# Patient Record
Sex: Female | Born: 1980 | Race: Black or African American | Hispanic: No | Marital: Single | Smoking: Never smoker
Health system: Southern US, Community
[De-identification: ages and names within clinical notes are randomized; demographics above are authoritative.]

## PROBLEM LIST (undated history)

## (undated) ENCOUNTER — Ambulatory Visit (HOSPITAL_COMMUNITY): Payer: 59

## (undated) DIAGNOSIS — A159 Respiratory tuberculosis unspecified: Secondary | ICD-10-CM

## (undated) HISTORY — PX: NO PAST SURGERIES: SHX2092

## (undated) HISTORY — DX: Respiratory tuberculosis unspecified: A15.9

---

## 2003-03-24 ENCOUNTER — Emergency Department (HOSPITAL_COMMUNITY): Admission: EM | Admit: 2003-03-24 | Discharge: 2003-03-24 | Payer: Self-pay | Admitting: Emergency Medicine

## 2005-07-19 ENCOUNTER — Emergency Department (HOSPITAL_COMMUNITY): Admission: EM | Admit: 2005-07-19 | Discharge: 2005-07-19 | Payer: Self-pay | Admitting: Emergency Medicine

## 2005-10-04 ENCOUNTER — Ambulatory Visit (HOSPITAL_COMMUNITY): Admission: RE | Admit: 2005-10-04 | Discharge: 2005-10-04 | Payer: Self-pay | Admitting: Obstetrics & Gynecology

## 2005-10-06 LAB — OB RESULTS CONSOLE VARICELLA ZOSTER ANTIBODY, IGG: Varicella: IMMUNE

## 2005-12-19 ENCOUNTER — Ambulatory Visit (HOSPITAL_COMMUNITY): Admission: RE | Admit: 2005-12-19 | Discharge: 2005-12-19 | Payer: Self-pay | Admitting: Obstetrics & Gynecology

## 2006-03-10 ENCOUNTER — Ambulatory Visit: Payer: Self-pay | Admitting: Obstetrics & Gynecology

## 2006-03-13 ENCOUNTER — Ambulatory Visit: Payer: Self-pay | Admitting: Obstetrics & Gynecology

## 2006-03-15 ENCOUNTER — Ambulatory Visit: Payer: Self-pay | Admitting: Obstetrics and Gynecology

## 2006-03-15 ENCOUNTER — Inpatient Hospital Stay (HOSPITAL_COMMUNITY): Admission: AD | Admit: 2006-03-15 | Discharge: 2006-03-18 | Payer: Self-pay | Admitting: Gynecology

## 2006-03-16 ENCOUNTER — Encounter (INDEPENDENT_AMBULATORY_CARE_PROVIDER_SITE_OTHER): Payer: Self-pay | Admitting: *Deleted

## 2007-04-05 ENCOUNTER — Emergency Department (HOSPITAL_COMMUNITY): Admission: EM | Admit: 2007-04-05 | Discharge: 2007-04-05 | Payer: Self-pay | Admitting: Emergency Medicine

## 2007-12-22 IMAGING — US US OB COMP +14 WK
1 series · 13 of 28 positions shown · non-contrast
Comparison: none

CLINICAL DATA: Anatomy scan; uncertain gestational age.

[Series 1: us ob comp +14 wk · 13 of 79 slices shown]
[im 3/79]
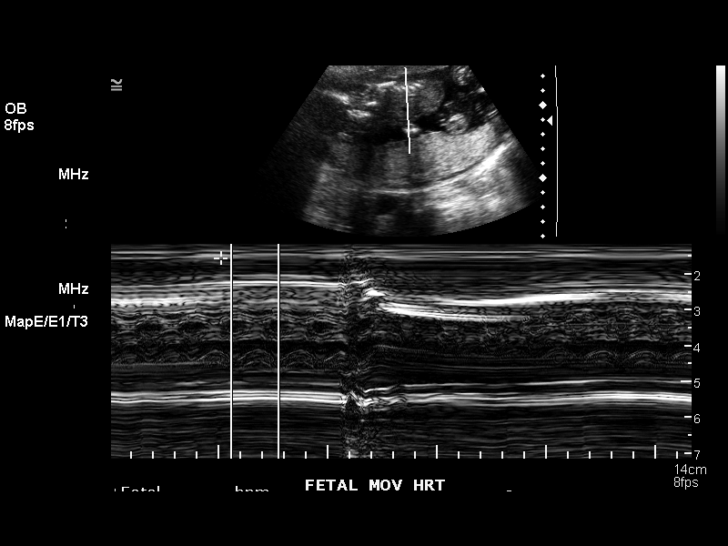
[im 9/79]
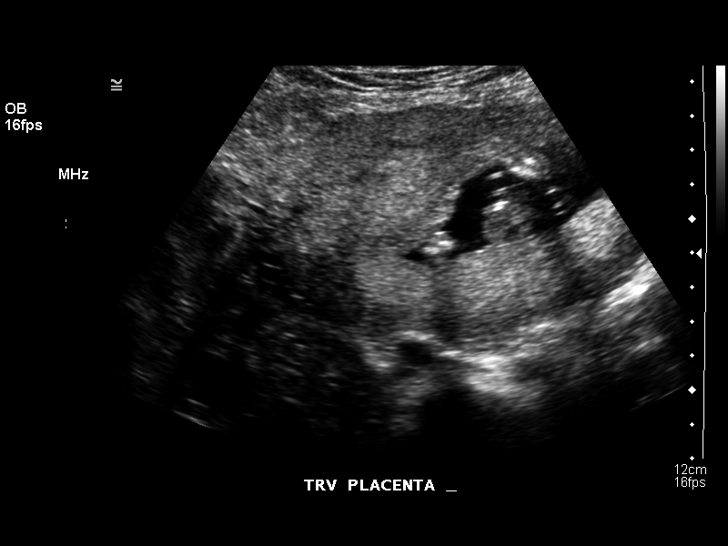
[im 15/79]
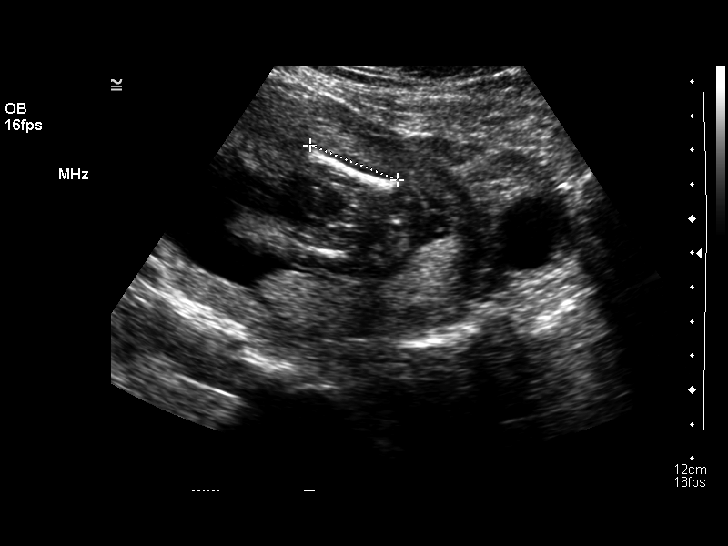
[im 21/79]
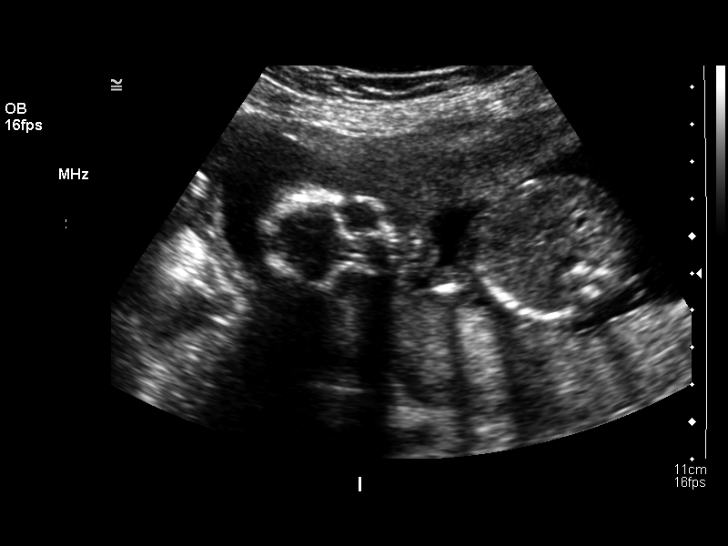
[im 27/79]
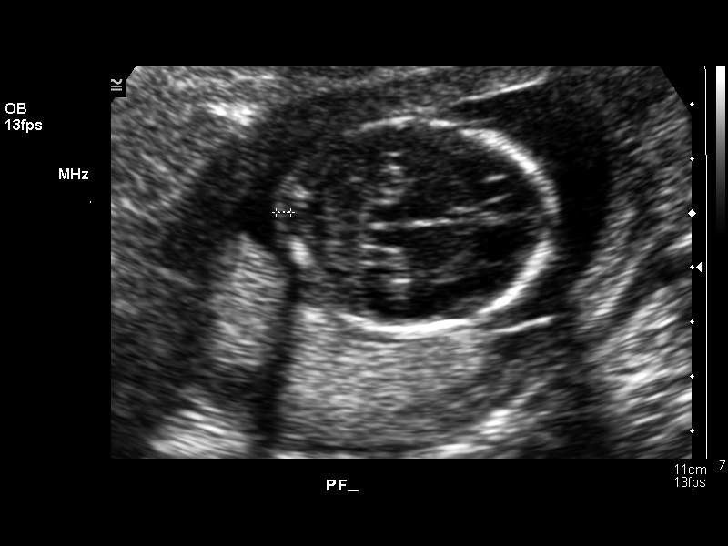
[im 32/79]
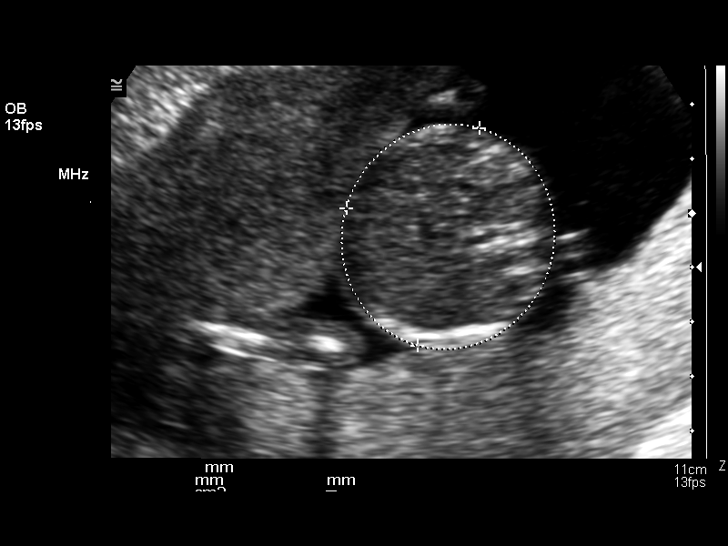
[im 41/79]
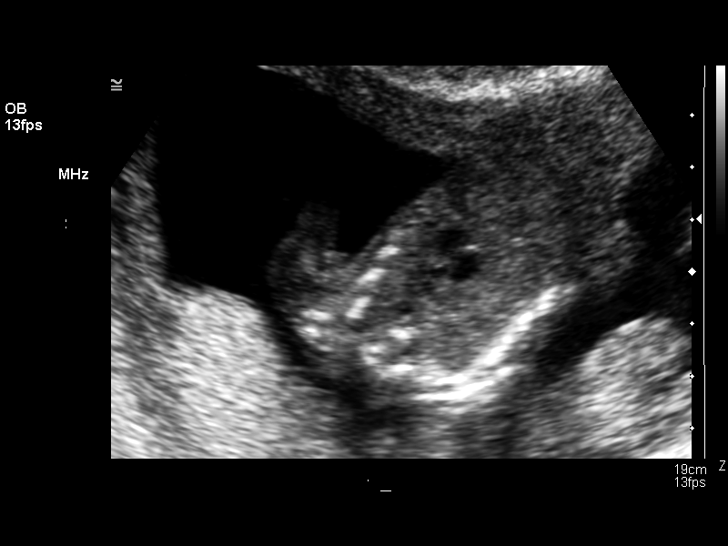
[im 47/79]
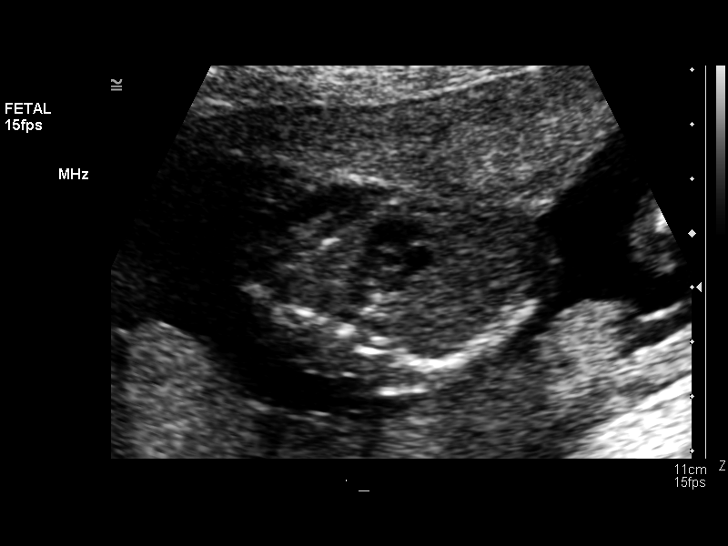
[im 53/79]
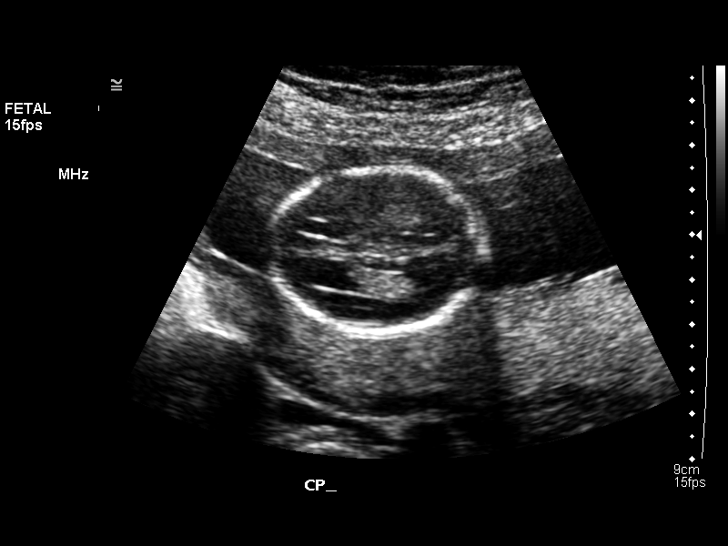
[im 58/79]
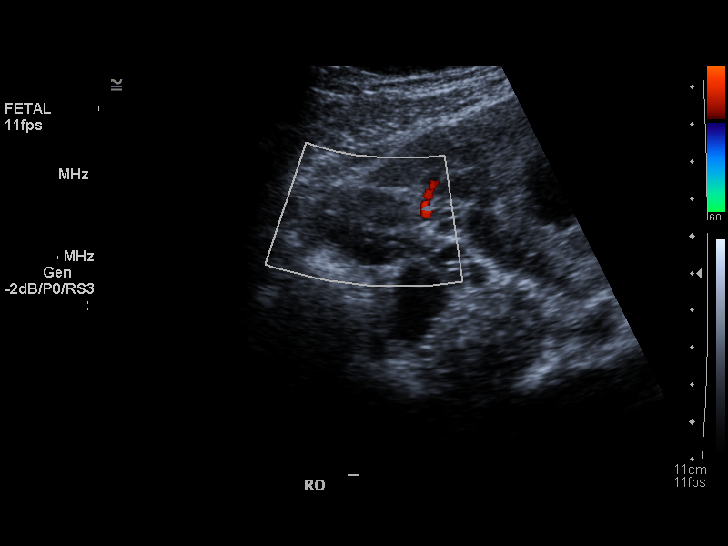
[im 64/79]
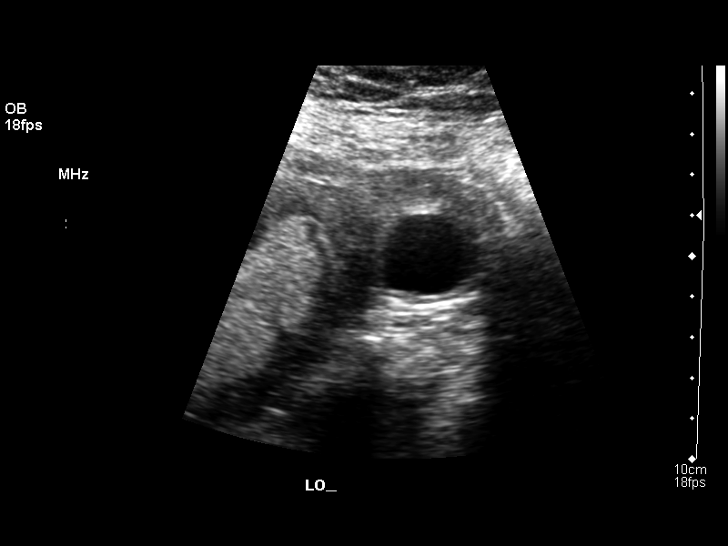
[im 70/79]
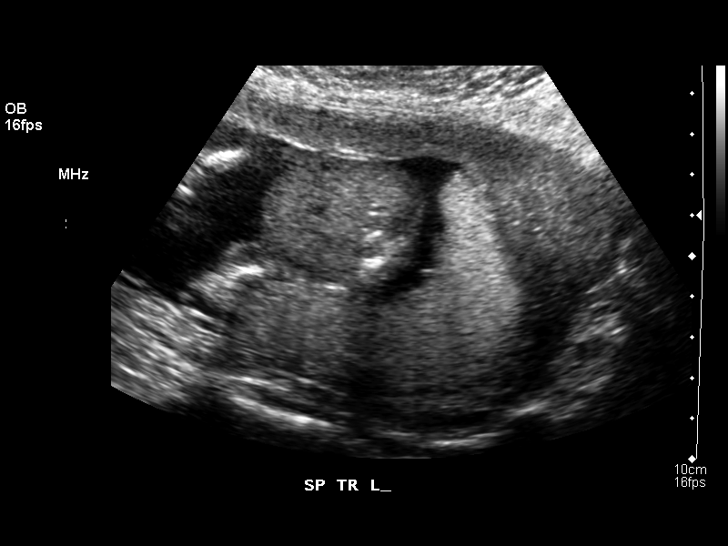
[im 76/79]
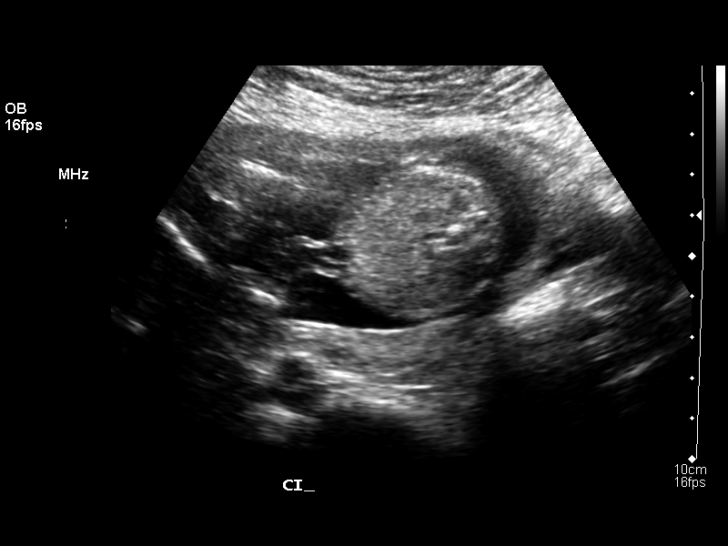

[13 of 28 positions shown; findings below may reference images not displayed]

OBSTETRICAL ULTRASOUND:
 Number of Fetuses: 1
 Heart Rate:  139
 Movement:  Yes
 Breathing:  No  
 Presentation:  Breech
 Placental Location:  Posterior
 Grade:  I
 Previa:  No
 Amniotic Fluid (Subjective):  Normal
 Amniotic Fluid (Objective):   4.0 cm Vertical pocket 

 FETAL BIOMETRY
 BPD:   3.4 cm   17 w 6 d
 HC:   15.0 cm   17 w 5 d
 AC:   13.0 cm   18 w 2 d
 FL:    2.7 cm   18 w 2 d

 MEAN GA:  18 w 0 d  US EDC: 03/07/06

 FETAL ANATOMY
 Lateral Ventricles:    Visualized 
 Thalami/CSP:      Visualized 
 Posterior Fossa:  Visualized 
 Nuchal Region:  NF= 2.4 mm  Visualized 
 Spine:      Visualized 
 4 Chamber Heart on Left:      Visualized 
 Stomach on Left:      Visualized 
 3 Vessel Cord:    Visualized 
 Cord Insertion site:    Visualized 
 Kidneys:  Visualized 
 Bladder:  Visualized   
 Extremities:      Visualized 

 ADDITIONAL ANATOMY VISUALIZED:  LVOT, RVOT, upper lip, orbits, profile, diaphragm, ductal arch, aortic arch, and male genitalia.  Nasal bone is seen.

 MATERNAL UTERINE AND ADNEXAL FINDINGS
 Cervix: 3.5 cm Transabdominally
 Normal right ovary.  Left ovary measures 2.7 x 2.0 x 1.9 cm with simple cyst.
IMPRESSION: There is a single living intrauterine gestation in variable presentation.  The mean gestational age by today's ultrasound is 18 weeks 0 days with an EDC of 03/07/06.  The fetal indices are concordant and the visualized anatomy is normal.

## 2010-03-19 ENCOUNTER — Emergency Department (HOSPITAL_COMMUNITY)
Admission: EM | Admit: 2010-03-19 | Discharge: 2010-03-19 | Payer: Self-pay | Source: Home / Self Care | Admitting: Emergency Medicine

## 2010-04-29 ENCOUNTER — Inpatient Hospital Stay (INDEPENDENT_AMBULATORY_CARE_PROVIDER_SITE_OTHER)
Admission: RE | Admit: 2010-04-29 | Discharge: 2010-04-29 | Disposition: A | Discharge status: Home / Self Care | Payer: No Typology Code available for payment source | Source: Ambulatory Visit | Attending: Family Medicine | Admitting: Family Medicine

## 2010-04-29 DIAGNOSIS — S139XXA Sprain of joints and ligaments of unspecified parts of neck, initial encounter: Secondary | ICD-10-CM

## 2010-05-31 LAB — URINE MICROSCOPIC-ADD ON

## 2010-05-31 LAB — URINALYSIS, ROUTINE W REFLEX MICROSCOPIC
Glucose, UA: NEGATIVE mg/dL
Hgb urine dipstick: NEGATIVE
Protein, ur: NEGATIVE mg/dL
Specific Gravity, Urine: 1.034 — ABNORMAL HIGH (ref 1.005–1.030)

## 2010-05-31 LAB — POCT PREGNANCY, URINE: Preg Test, Ur: NEGATIVE

## 2010-05-31 LAB — GLUCOSE, CAPILLARY: Glucose-Capillary: 123 mg/dL — ABNORMAL HIGH (ref 70–99)

## 2010-12-09 LAB — PREGNANCY, URINE: Preg Test, Ur: NEGATIVE

## 2010-12-09 LAB — URINALYSIS, ROUTINE W REFLEX MICROSCOPIC
Nitrite: NEGATIVE
Specific Gravity, Urine: 1.026
Urobilinogen, UA: 1

## 2010-12-13 ENCOUNTER — Emergency Department (HOSPITAL_COMMUNITY)
Admission: EM | Admit: 2010-12-13 | Discharge: 2010-12-13 | Disposition: A | Discharge status: Home / Self Care | Payer: Self-pay | Attending: Emergency Medicine | Admitting: Emergency Medicine

## 2010-12-13 DIAGNOSIS — R6883 Chills (without fever): Secondary | ICD-10-CM | POA: Insufficient documentation

## 2010-12-13 DIAGNOSIS — M545 Low back pain, unspecified: Secondary | ICD-10-CM | POA: Insufficient documentation

## 2010-12-13 DIAGNOSIS — N12 Tubulo-interstitial nephritis, not specified as acute or chronic: Secondary | ICD-10-CM | POA: Insufficient documentation

## 2010-12-13 LAB — POCT PREGNANCY, URINE: Preg Test, Ur: NEGATIVE

## 2010-12-13 LAB — CBC
Hemoglobin: 11 g/dL — ABNORMAL LOW (ref 12.0–15.0)
MCH: 30.4 pg (ref 26.0–34.0)
RBC: 3.62 MIL/uL — ABNORMAL LOW (ref 3.87–5.11)
WBC: 5.2 10*3/uL (ref 4.0–10.5)

## 2010-12-13 LAB — DIFFERENTIAL
Basophils Relative: 0 % (ref 0–1)
Monocytes Relative: 21 % — ABNORMAL HIGH (ref 3–12)
Neutro Abs: 2.5 10*3/uL (ref 1.7–7.7)
Neutrophils Relative %: 48 % (ref 43–77)

## 2010-12-13 LAB — BASIC METABOLIC PANEL
CO2: 27 mEq/L (ref 19–32)
Calcium: 8.5 mg/dL (ref 8.4–10.5)
Chloride: 107 mEq/L (ref 96–112)
Glucose, Bld: 110 mg/dL — ABNORMAL HIGH (ref 70–99)
Potassium: 3.5 mEq/L (ref 3.5–5.1)
Sodium: 138 mEq/L (ref 135–145)

## 2010-12-13 LAB — URINALYSIS, ROUTINE W REFLEX MICROSCOPIC
Bilirubin Urine: NEGATIVE
Ketones, ur: NEGATIVE mg/dL
Nitrite: NEGATIVE
Urobilinogen, UA: 1 mg/dL (ref 0.0–1.0)

## 2010-12-13 LAB — URINE MICROSCOPIC-ADD ON

## 2010-12-14 LAB — URINE CULTURE
Colony Count: >=100000
Culture  Setup Time: 201209241132

## 2010-12-23 ENCOUNTER — Inpatient Hospital Stay (INDEPENDENT_AMBULATORY_CARE_PROVIDER_SITE_OTHER)
Admission: RE | Admit: 2010-12-23 | Discharge: 2010-12-23 | Disposition: A | Discharge status: Home / Self Care | Payer: Self-pay | Source: Ambulatory Visit | Attending: Emergency Medicine | Admitting: Emergency Medicine

## 2010-12-23 DIAGNOSIS — N12 Tubulo-interstitial nephritis, not specified as acute or chronic: Secondary | ICD-10-CM

## 2010-12-23 LAB — POCT URINALYSIS DIP (DEVICE)
Hgb urine dipstick: NEGATIVE
Leukocytes, UA: NEGATIVE
Nitrite: NEGATIVE
Protein, ur: NEGATIVE mg/dL
Urobilinogen, UA: 0.2 mg/dL (ref 0.0–1.0)
pH: 5.5 (ref 5.0–8.0)

## 2014-02-14 ENCOUNTER — Emergency Department (HOSPITAL_COMMUNITY)
Admission: EM | Admit: 2014-02-14 | Discharge: 2014-02-14 | Disposition: A | Discharge status: Home / Self Care | Payer: No Typology Code available for payment source | Attending: Emergency Medicine | Admitting: Emergency Medicine

## 2014-02-14 DIAGNOSIS — R51 Headache: Secondary | ICD-10-CM | POA: Insufficient documentation

## 2014-02-14 DIAGNOSIS — R519 Headache, unspecified: Secondary | ICD-10-CM

## 2014-02-14 DIAGNOSIS — R0981 Nasal congestion: Secondary | ICD-10-CM | POA: Insufficient documentation

## 2014-02-14 MED ORDER — SODIUM CHLORIDE 0.9 % IV BOLUS (SEPSIS)
1000.0000 mL | Freq: Once | INTRAVENOUS | Status: AC
  Administered 2014-02-14: 1000 mL via INTRAVENOUS

## 2014-02-14 MED ORDER — METOCLOPRAMIDE HCL 5 MG/ML IJ SOLN
10.0000 mg | Freq: Once | INTRAMUSCULAR | Status: AC
  Administered 2014-02-14: 10 mg via INTRAVENOUS
  Filled 2014-02-14: qty 2

## 2014-02-14 MED ORDER — DIPHENHYDRAMINE HCL 50 MG/ML IJ SOLN
25.0000 mg | Freq: Once | INTRAMUSCULAR | Status: AC
  Administered 2014-02-14: 25 mg via INTRAVENOUS
  Filled 2014-02-14: qty 1

## 2014-02-14 MED ORDER — KETOROLAC TROMETHAMINE 30 MG/ML IJ SOLN
30.0000 mg | Freq: Once | INTRAMUSCULAR | Status: AC
  Administered 2014-02-14: 30 mg via INTRAVENOUS
  Filled 2014-02-14: qty 1

## 2014-02-14 MED ORDER — IBUPROFEN 800 MG PO TABS: 800.0000 mg | ORAL_TABLET | Freq: Three times a day (TID) | ORAL | Status: DC | PRN

## 2014-02-14 NOTE — ED Provider Notes (Signed)
CSN: [id number]     Arrival date & time 02/14/14  1014 History   First MD Initiated Contact with Patient 02/14/14 1017     Chief Complaint  Patient presents with  . Headache     (Consider location/radiation/quality/duration/timing/severity/associated sxs/prior Treatment) HPI   Pt p/w left frontal headache that has been intermittent over the past 3 days.  Lasts 2-3 hours at time, has temporarily mild improvement with aleve, cold compresses.  At worst her pain is 10/10 intensity, throbbing/pounding.  Currently 9/10.  Does have associated nasal congestion.  Denies fevers, recent illness, head trauma, neck pain or stiffness, sudden onset or "thunderclap" headache.  Denies focal neurologic deficits. Denies URI symptoms, vision change, N/V, dental pain.  LMP last week, normal (pt has irregular periods).  Denies possibility of pregnancy.     No past medical history on file. No past surgical history on file. No family history on file. History  Substance Use Topics  . Smoking status: Not on file  . Smokeless tobacco: Not on file  . Alcohol Use: Not on file   OB History    No data available     Review of Systems  All other systems reviewed and are negative.     Allergies  Review of patient's allergies indicates not on file.  Home Medications   Prior to Admission medications   Not on File   BP 119/78 mmHg  Pulse 96  Temp(Src) 98.3 F (36.8 C) (Oral)  Resp 18  Ht 5' (1.524 m)  Wt 160 lb (72.576 kg)  BMI 31.25 kg/m2  SpO2 100% Physical Exam  Constitutional: She appears well-developed and well-nourished. No distress.  HENT:  Head: Normocephalic and atraumatic.  Neck: Normal range of motion. Neck supple.  Cardiovascular: Normal rate and regular rhythm.   Pulmonary/Chest: Effort normal and breath sounds normal. No respiratory distress. She has no wheezes. She has no rales.  Abdominal: Soft. She exhibits no distension. There is no tenderness. There is no rebound and no  guarding.  Neurological: She is alert.  CN II-XII intact, EOMs intact, no pronator drift, grip strengths equal bilaterally; strength 5/5 in all extremities, sensation intact in all extremities; finger to nose, heel to shin, rapid alternating movements normal; gait is normal.     Skin: She is not diaphoretic.  Nursing note and vitals reviewed.   ED Course  Procedures (including critical care time) Labs Review Labs Reviewed - No data to display  Imaging Review No results found.   EKG Interpretation None     12:13 PM Pt reports complete relief of pain.   MDM   Final diagnoses:  Acute nonintractable headache, unspecified headache type    Afebrile, nontoxic patient with headache.  No red flags including head trauma, fevers, meningeal signs, focal neurologic deficits.  Migraine cocktail given with complete relief.    D/C home with PCP follow up.  Discussed result, findings, treatment, and follow up  with patient.  Pt given return precautions.  Pt verbalizes understanding and agrees with plan.      Trixie Dredgemily Armond Cuthrell, PA-C 02/14/14 1618  Doug SouSam Jacubowitz, MD 02/14/14 1705

## 2014-02-14 NOTE — ED Notes (Signed)
Patient to ED with C/O a headache that began yesterday at 7 PM. Pain is on the left side of her head.  Indicates a left frontal headache. Pain is temporarily relieved with aleve.  Denies nausea, vomiting and visual changes.  Denies weakness.

## 2014-02-14 NOTE — Discharge Instructions (Signed)
Read the information below.  Use the prescribed medication as directed.  Please discuss all new medications with your pharmacist.  You may return to the Emergency Department at any time for worsening condition or any new symptoms that concern you.    If there is any possibility that you might be pregnant, please let your health care provider know and discuss this with the pharmacist to ensure medication safety.    You are having a headache. No specific cause was found today for your headache. It may have been a migraine or other cause of headache. Stress, anxiety, fatigue, and depression are common triggers for headaches. Your headache today does not appear to be life-threatening or require hospitalization, but often the exact cause of headaches is not determined in the emergency department. Therefore, follow-up with your doctor is very important to find out what may have caused your headache, and whether or not you need any further diagnostic testing or treatment. Sometimes headaches can appear benign (not harmful), but then more serious symptoms can develop which should prompt an immediate re-evaluation by your doctor or the emergency department. SEEK MEDICAL ATTENTION IF: You develop possible problems with medications prescribed.  The medications don't resolve your headache, if it recurs , or if you have multiple episodes of vomiting or can't take fluids. You have a change from the usual headache. RETURN IMMEDIATELY IF you develop a sudden, severe headache or confusion, become poorly responsive or faint, develop a fever above 100.25F or problem breathing, have a change in speech, vision, swallowing, or understanding, or develop new weakness, numbness, tingling, incoordination, or have a seizure.    General Headache Without Cause A general headache is pain or discomfort felt around the head or neck area. The cause may not be found.  HOME CARE   Keep all doctor visits.  Only take medicines as told by  your doctor.  Lie down in a dark, quiet room when you have a headache.  Keep a journal to find out if certain things bring on headaches. For example, write down:  What you eat and drink.  How much sleep you get.  Any change to your diet or medicines.  Relax by getting a massage or doing other relaxing activities.  Put ice or heat packs on the head and neck area as told by your doctor.  Lessen stress.  Sit up straight. Do not tighten (tense) your muscles.  Quit smoking if you smoke.  Lessen how much alcohol you drink.  Lessen how much caffeine you drink, or stop drinking caffeine.  Eat and sleep on a regular schedule.  Get 7 to 9 hours of sleep, or as told by your doctor.  Keep lights dim if bright lights bother you or make your headaches worse. GET HELP RIGHT AWAY IF:   Your headache becomes really bad.  You have a fever.  You have a stiff neck.  You have trouble seeing.  Your muscles are weak, or you lose muscle control.  You lose your balance or have trouble walking.  You feel like you will pass out (faint), or you pass out.  You have really bad symptoms that are different than your first symptoms.  You have problems with the medicines given to you by your doctor.  Your medicines do not work.  Your headache feels different than the other headaches.  You feel sick to your stomach (nauseous) or throw up (vomit). MAKE SURE YOU:   Understand these instructions.  Will watch your condition.  Will get help right away if you are not doing well or get worse. Document Released: 12/15/2007 Document Revised: 05/30/2011 Document Reviewed: 02/25/2011 [facility] Patient Information 2015 [place]. This information is not intended to replace advice given to you by your health care provider. Make sure you discuss any questions you have with your health care provider.  Headaches, Frequently Asked Questions MIGRAINE HEADACHES Q: What is migraine? What causes it?  How can I treat it? A: Generally, migraine headaches begin as a dull ache. Then they develop into a constant, throbbing, and pulsating pain. You may experience pain at the temples. You may experience pain at the front or back of one or both sides of the head. The pain is usually accompanied by a combination of:  Nausea.  Vomiting.  Sensitivity to light and noise. Some people (about 15%) experience an aura (see below) before an attack. The cause of migraine is believed to be chemical reactions in the brain. Treatment for migraine may include over-the-counter or prescription medications. It may also include self-help techniques. These include relaxation training and biofeedback.  Q: What is an aura? A: About 15% of people with migraine get an "aura". This is a sign of neurological symptoms that occur before a migraine headache. You may see wavy or jagged lines, dots, or flashing lights. You might experience tunnel vision or blind spots in one or both eyes. The aura can include visual or auditory hallucinations (something imagined). It may include disruptions in smell (such as strange odors), taste or touch. Other symptoms include:  Numbness.  A "pins and needles" sensation.  Difficulty in recalling or speaking the correct word. These neurological events may last as long as 60 minutes. These symptoms will fade as the headache begins. Q: What is a trigger? A: Certain physical or environmental factors can lead to or "trigger" a migraine. These include:  Foods.  Hormonal changes.  Weather.  Stress. It is important to remember that triggers are different for everyone. To help prevent migraine attacks, you need to figure out which triggers affect you. Keep a headache diary. This is a good way to track triggers. The diary will help you talk to your healthcare professional about your condition. Q: Does weather affect migraines? A: Bright sunshine, hot, humid conditions, and drastic changes in  barometric pressure may lead to, or "trigger," a migraine attack in some people. But studies have shown that weather does not act as a trigger for everyone with migraines. Q: What is the link between migraine and hormones? A: Hormones start and regulate many of your body's functions. Hormones keep your body in balance within a constantly changing environment. The levels of hormones in your body are unbalanced at times. Examples are during menstruation, pregnancy, or menopause. That can lead to a migraine attack. In fact, about three quarters of all women with migraine report that their attacks are related to the menstrual cycle.  Q: Is there an increased risk of stroke for migraine sufferers? A: The likelihood of a migraine attack causing a stroke is very remote. That is not to say that migraine sufferers cannot have a stroke associated with their migraines. In persons under age 32, the most common associated factor for stroke is migraine headache. But over the course of a person's normal life span, the occurrence of migraine headache may actually be associated with a reduced risk of dying from cerebrovascular disease due to stroke.  Q: What are acute medications for migraine? A: Acute medications are used to  treat the pain of the headache after it has started. Examples over-the-counter medications, NSAIDs, ergots, and triptans.  Q: What are the triptans? A: Triptans are the newest class of abortive medications. They are specifically targeted to treat migraine. Triptans are vasoconstrictors. They moderate some chemical reactions in the brain. The triptans work on receptors in your brain. Triptans help to restore the balance of a neurotransmitter called serotonin. Fluctuations in levels of serotonin are thought to be a main cause of migraine.  Q: Are over-the-counter medications for migraine effective? A: Over-the-counter, or "OTC," medications may be effective in relieving mild to moderate pain and  associated symptoms of migraine. But you should see your caregiver before beginning any treatment regimen for migraine.  Q: What are preventive medications for migraine? A: Preventive medications for migraine are sometimes referred to as "prophylactic" treatments. They are used to reduce the frequency, severity, and length of migraine attacks. Examples of preventive medications include antiepileptic medications, antidepressants, beta-blockers, calcium channel blockers, and NSAIDs (nonsteroidal anti-inflammatory drugs). Q: Why are anticonvulsants used to treat migraine? A: During the past few years, there has been an increased interest in antiepileptic drugs for the prevention of migraine. They are sometimes referred to as "anticonvulsants". Both epilepsy and migraine may be caused by similar reactions in the brain.  Q: Why are antidepressants used to treat migraine? A: Antidepressants are typically used to treat people with depression. They may reduce migraine frequency by regulating chemical levels, such as serotonin, in the brain.  Q: What alternative therapies are used to treat migraine? A: The term "alternative therapies" is often used to describe treatments considered outside the scope of conventional Western medicine. Examples of alternative therapy include acupuncture, acupressure, and yoga. Another common alternative treatment is herbal therapy. Some herbs are believed to relieve headache pain. Always discuss alternative therapies with your caregiver before proceeding. Some herbal products contain arsenic and other toxins. TENSION HEADACHES Q: What is a tension-type headache? What causes it? How can I treat it? A: Tension-type headaches occur randomly. They are often the result of temporary stress, anxiety, fatigue, or anger. Symptoms include soreness in your temples, a tightening band-like sensation around your head (a "vice-like" ache). Symptoms can also include a pulling feeling, pressure  sensations, and contracting head and neck muscles. The headache begins in your forehead, temples, or the back of your head and neck. Treatment for tension-type headache may include over-the-counter or prescription medications. Treatment may also include self-help techniques such as relaxation training and biofeedback. CLUSTER HEADACHES Q: What is a cluster headache? What causes it? How can I treat it? A: Cluster headache gets its name because the attacks come in groups. The pain arrives with little, if any, warning. It is usually on one side of the head. A tearing or bloodshot eye and a runny nose on the same side of the headache may also accompany the pain. Cluster headaches are believed to be caused by chemical reactions in the brain. They have been described as the most severe and intense of any headache type. Treatment for cluster headache includes prescription medication and oxygen. SINUS HEADACHES Q: What is a sinus headache? What causes it? How can I treat it? A: When a cavity in the bones of the face and skull (a sinus) becomes inflamed, the inflammation will cause localized pain. This condition is usually the result of an allergic reaction, a tumor, or an infection. If your headache is caused by a sinus blockage, such as an infection, you will probably have  a fever. An x-ray will confirm a sinus blockage. Your caregiver's treatment might include antibiotics for the infection, as well as antihistamines or decongestants.  REBOUND HEADACHES Q: What is a rebound headache? What causes it? How can I treat it? A: A pattern of taking acute headache medications too often can lead to a condition known as "rebound headache." A pattern of taking too much headache medication includes taking it more than 2 days per week or in excessive amounts. That means more than the label or a caregiver advises. With rebound headaches, your medications not only stop relieving pain, they actually begin to cause headaches.  Doctors treat rebound headache by tapering the medication that is being overused. Sometimes your caregiver will gradually substitute a different type of treatment or medication. Stopping may be a challenge. Regularly overusing a medication increases the potential for serious side effects. Consult a caregiver if you regularly use headache medications more than 2 days per week or more than the label advises. ADDITIONAL QUESTIONS AND ANSWERS Q: What is biofeedback? A: Biofeedback is a self-help treatment. Biofeedback uses special equipment to monitor your body's involuntary physical responses. Biofeedback monitors:  Breathing.  Pulse.  Heart rate.  Temperature.  Muscle tension.  Brain activity. Biofeedback helps you refine and perfect your relaxation exercises. You learn to control the physical responses that are related to stress. Once the technique has been mastered, you do not need the equipment any more. Q: Are headaches hereditary? A: Four out of five (80%) of people that suffer report a family history of migraine. Scientists are not sure if this is genetic or a family predisposition. Despite the uncertainty, a child has a 50% chance of having migraine if one parent suffers. The child has a 75% chance if both parents suffer.  Q: Can children get headaches? A: By the time they reach high school, most young people have experienced some type of headache. Many safe and effective approaches or medications can prevent a headache from occurring or stop it after it has begun.  Q: What type of doctor should I see to diagnose and treat my headache? A: Start with your primary caregiver. Discuss his or her experience and approach to headaches. Discuss methods of classification, diagnosis, and treatment. Your caregiver may decide to recommend you to a headache specialist, depending upon your symptoms or other physical conditions. Having diabetes, allergies, etc., may require a more comprehensive and  inclusive approach to your headache. The National Headache Foundation will provide, upon request, a list of [facility] physician members in your state. Document Released: 05/28/2003 Document Revised: 05/30/2011 Document Reviewed: 11/05/2007 [facility] Patient Information 2015 [place], [place]. This information is not intended to replace advice given to you by your health care provider. Make sure you discuss any questions you have with your health care provider.    Emergency Department Resource Guide 1) Find a Doctor and Pay Out of Pocket Although you won't have to find out who is covered by your insurance plan, it is a good idea to ask around and get recommendations. You will then need to call the office and see if the doctor you have chosen will accept you as a new patient and what types of options they offer for patients who are self-pay. Some doctors offer discounts or will set up payment plans for their patients who do not have insurance, but you will need to ask so you aren't surprised when you get to your appointment.  2) Contact Your Local Health Department Not all  health departments have doctors that can see patients for sick visits, but many do, so it is worth a call to see if yours does. If you don't know where your local health department is, you can check in your phone book. The CDC also has a tool to help you locate your state's health department, and many state websites also have listings of all of their local health departments.  3) Find a Walk-in Clinic If your illness is not likely to be very severe or complicated, you may want to try a walk in clinic. These are popping up all over the country in pharmacies, drugstores, and shopping centers. They're usually staffed by nurse practitioners or physician assistants that have been trained to treat common illnesses and complaints. They're usually fairly quick and inexpensive. However, if you have serious medical issues or chronic medical problems,  these are probably not your best option.  No Primary Care Doctor: - Call Health Connect at  [phone] - they can help you locate a primary care doctor that  accepts your insurance, provides certain services, etc. - Physician Referral Service- [phone]  Chronic Pain Problems: Organization         Address  Phone   Notes  [facility]  [phone] Patients need to be referred by their primary care doctor.   Medication Assistance: Organization         Address  Phone   Notes  [facility] Medication [facility] [address] [place], [place] [address] [phone] --Must be a resident of [facility] -- Must have NO insurance coverage whatsoever (no Medicaid/ Medicare, etc.) -- The pt. MUST have a primary care doctor that directs their care regularly and follows them in the community   [facility]  [phone]   [employer]  [phone]    Agencies that provide inexpensive medical care: Organization         Address  Phone   Notes  [facility]  [phone]   [facility]    [phone]   [facility] [address] [place], [place] [address] [phone]   [facility] [address] [place] [phone]   Planned Parenthood    [phone]   [facility]    [phone]   [facility]  [address], [place] Phone:  [phone], Fax:  [phone] Hours of Operation:  9 am - 6 pm, M-F.  Also accepts Medicaid/Medicare and self-pay.  [facility]  [address], [place] Phone: [phone], Fax: [phone]. Hours of Operation:  8:30 am - 5:30 pm, M-F.  Also accepts Medicaid and self-pay.  [facility] [place] [address], [place] Phone: [phone]   [facility] [address] Natasha Bence [place], [place] [phone], Ext. [phone] Mondays &  Thursdays: 7-9 AM.  First 15 patients are seen on a first come, first serve basis.    Medicaid-accepting [facility] Providers:  Organization         Address  Phone   Notes  [facility] [address], [place] [phone] Also accepts self-pay patients.  [facility] [address] Laurell Josephs [place], [place]  [phone]   [facility] [address], [place] [phone]   [facility] [address] [place] [phone]   Renaye Rakers [address], [place]   [phone] Only accepts [place] patients after they have their name applied to their card.   Self-Pay (no insurance) in [facility]:  Organization         Address  Phone   Notes  Sickle Cell Patients, [facility] [address] [place], [place] [phone]   [facility] [address] [place], [place] [phone]   [facility] [place]  [address], [place] [phone]   [facility]/Dr. Osei-Bonsu  [address], [place] or [address], [place] [phone] Phone number for both [place] and [place] locations is the same.  [facility] [address] [place] [phone]   [facility] [address], [place] or [address] [phone]   [facility] [address], [place] [phone], phone; [phone], fax Sees patients 1st and 3rd Saturday of every month.  Must not qualify for public or private insurance (i.e. Medicaid, Medicare, [place] Health Choice, Veterans' Benefits)  Household income should be no more than 200% of the poverty level The clinic cannot treat you if you are pregnant or think you are pregnant  Sexually transmitted diseases are not treated at the  clinic.    Dental Care: Organization         Address  Phone  Notes  [facility] [address] [place], [place] [phone] Accepts children up to age 73 who are enrolled in [place] or [place] Health Choice; pregnant women with a Medicaid card; and children who have applied for Medicaid or [place] Health Choice, but were declined, whose parents can pay a reduced fee at time of service.  [facility]  [address], [place] [phone] Accepts children up to age 26 who are enrolled in [place] or [place] Health Choice; pregnant women with a Medicaid card; and children who have applied for Medicaid or [place] Health Choice, but were declined, whose parents can pay a reduced fee at time of service.  [facility]  [address] [place], [place] [phone] Patients are seen by appointment only. Walk-ins are not accepted. [facility] will see patients 49 years of age and older. Monday - Tuesday (8am-5pm) Most Wednesdays (8:30-5pm) $30 per visit, cash only  [facility]  [address], [place] [facility] [phone] Patients are seen by appointment only. Walk-ins are not accepted. [facility] will see patients 26 years of age and older. One Wednesday Evening (Monthly: Volunteer Based).  $30 per visit, cash only  [employer]  [phone] for adults; Children under age 44, call Graduate Pediatric Dentistry at [phone]. Children aged 95-14, please call [phone] to request a pediatric application.  Dental services are provided in all areas of dental care including fillings, crowns and bridges, complete and partial dentures, implants, gum treatment, root canals, and extractions. Preventive care is also provided. Treatment is provided to both adults and children. Patients are selected via a lottery and there is often a  waiting list.   [facility] [address] [place]  [phone] [email or web address]   [facility] [address] [place], [place] [phone], Ext. [phone] Second and Fourth Thursday of each month, opens at 6:30 AM; Clinic ends at 9 AM.  Patients are seen on a first-come first-served basis, and a limited number are seen during each clinic.   [facility]  [address] Ether Griffins [place], [place] [phone]   Eligibility Requirements You must have lived in [place], [place], or [place] counties for at least the last three months.   You cannot be eligible for state or federal sponsored [place], including [employer], [place], or [employer].   You generally cannot be eligible for healthcare insurance through your employer.    How to apply: Eligibility screenings are held every Tuesday and Wednesday afternoon from 1:00 pm until 4:00 pm. You do not need an appointment for the interview!  [facility] [address], [place], [place] [phone]   [facility]  [phone]   [facility] Health Department  [phone]   [facility]  [phone]    Behavioral Health Resources in the Community: Intensive Outpatient Programs Organization         Address  Phone  Notes  [facility] [address], [place], [place] [phone]   [facility] [address] [place], [place] [phone]   [facility] [address] [place], [place]  [phone]   [facility] [address],  [place], [place] [phone] or [phone]   Substance Abuse Resources Organization         Address  Phone  Notes  [facility]  [phone]   [facility]  [phone]   [facility]  [phone]   Floydene Flock  [phone]   Residential & Outpatient Substance Abuse  Program  [phone]   Psychological Services Organization         Address  Phone  Notes  [place] [facility]  [phone]   [facility]  [phone]   [facility] [address], [place] [phone] or [phone]    Mobile Crisis Teams Organization         Address  Phone  Notes  [facility], Mobile Crisis Care Unit  [phone]   [facility]  [address] [place], [place] [phone]   Doristine Locks [address] [place] [phone]    Self-Help/Support Groups Organization         Address  Phone             Notes  [facility] [place] - variety of support groups  [phone]- [phone] Call for more information  Narcotics Anonymous (NA), [facility] [address], [employer] [place]  2 meetings at this location   Statistician         Address  Phone  Notes  [facility] [address],    [place]  [phone]   [facility]  [address] [place] [address], [place], [place] [phone]   [facility] [address] [place], [place] [phone] Admissions: 8am-3pm M-F  [facility] [address]    [place], [place] [phone]   [facility] [address] Starling Manns [place], [place] [phone]   [facility] [address]  [place], [place] [phone]   [facility] - Intensive Outpatient [phone]urell Josephs  [address], [place], [place] [phone]   [facility] [address]  [place], [place] [phone] or [phone]   [facility] ([facility]) [address] [place], [place] [phone] Accepts Medicaid  [facility] [address]  [place] [phone] Substance Abuse/Addiction Treatment   [facility] Resources Organization          Address  Phone  Notes  [facility]  [phone]) [phone]   Angie FavaJulie Brannon, PhD [address], [address] [place], [place]   [phone]) [phone] or [phone](336) [phone]   [facility]   [address] [place], [place] [phone](336) [phone]   [facility] [address], [place], [place] [phone]) [phone] Insurance/Medicaid/sponsorship through [facility]  [facility] [address]                                    [place], [place] [phone](336) [phone] Therapy/tele-psych/case  [facility] [address]   [place], [place] [phone](336) [phone]    Dr. Lolly MustacheArfeen  [phone](336) [phone]   [facility]  [employer] [facility] 1) [address], [place] 2) [address], [place] 3)  [address], [place] [phone]) [phone](336) [phone]  [phone]) [phone]   [facility] Child Abuse Hotline [phone](336) [phone] or [phone](336) [phone] (After Hours)

## 2014-12-01 LAB — PROCEDURE REPORT - SCANNED: Pap: NEGATIVE

## 2014-12-10 ENCOUNTER — Other Ambulatory Visit (HOSPITAL_COMMUNITY): Payer: Self-pay | Admitting: Obstetrics

## 2014-12-10 DIAGNOSIS — N979 Female infertility, unspecified: Secondary | ICD-10-CM

## 2014-12-11 ENCOUNTER — Other Ambulatory Visit (HOSPITAL_COMMUNITY): Payer: Self-pay | Admitting: Obstetrics

## 2014-12-11 DIAGNOSIS — N979 Female infertility, unspecified: Secondary | ICD-10-CM

## 2014-12-12 ENCOUNTER — Ambulatory Visit (HOSPITAL_COMMUNITY): Payer: No Typology Code available for payment source

## 2014-12-17 ENCOUNTER — Other Ambulatory Visit (HOSPITAL_COMMUNITY): Payer: Self-pay | Admitting: Obstetrics

## 2014-12-17 ENCOUNTER — Ambulatory Visit (HOSPITAL_COMMUNITY)
Admission: RE | Admit: 2014-12-17 | Discharge: 2014-12-17 | Disposition: A | Discharge status: Home / Self Care | Payer: BLUE CROSS/BLUE SHIELD | Source: Ambulatory Visit | Attending: Obstetrics | Admitting: Obstetrics

## 2014-12-17 DIAGNOSIS — N979 Female infertility, unspecified: Secondary | ICD-10-CM

## 2015-01-09 ENCOUNTER — Ambulatory Visit (HOSPITAL_COMMUNITY): Admission: RE | Admit: 2015-01-09 | Payer: BLUE CROSS/BLUE SHIELD | Source: Ambulatory Visit

## 2015-01-19 ENCOUNTER — Ambulatory Visit (HOSPITAL_COMMUNITY)
Admission: RE | Admit: 2015-01-19 | Discharge: 2015-01-19 | Disposition: A | Discharge status: Home / Self Care | Payer: BLUE CROSS/BLUE SHIELD | Source: Ambulatory Visit | Attending: Obstetrics | Admitting: Obstetrics

## 2015-01-19 ENCOUNTER — Other Ambulatory Visit (HOSPITAL_COMMUNITY): Payer: Self-pay | Admitting: Obstetrics

## 2015-01-19 DIAGNOSIS — N979 Female infertility, unspecified: Secondary | ICD-10-CM

## 2015-03-25 ENCOUNTER — Ambulatory Visit (HOSPITAL_COMMUNITY)
Admission: RE | Admit: 2015-03-25 | Discharge: 2015-03-25 | Disposition: A | Discharge status: Home / Self Care | Payer: BLUE CROSS/BLUE SHIELD | Source: Ambulatory Visit | Attending: Obstetrics | Admitting: Obstetrics

## 2015-03-25 DIAGNOSIS — N979 Female infertility, unspecified: Secondary | ICD-10-CM | POA: Insufficient documentation

## 2015-03-25 MED ORDER — IOHEXOL 300 MG/ML  SOLN
30.0000 mL | Freq: Once | INTRAMUSCULAR | Status: AC | PRN
  Administered 2015-03-25: 30 mL

## 2016-05-26 ENCOUNTER — Inpatient Hospital Stay (HOSPITAL_COMMUNITY)
Admission: AD | Admit: 2016-05-26 | Discharge: 2016-05-26 | Discharge status: Against Medical Advice | Payer: BLUE CROSS/BLUE SHIELD | Source: Ambulatory Visit | Attending: Obstetrics and Gynecology | Admitting: Obstetrics and Gynecology

## 2016-05-26 DIAGNOSIS — R109 Unspecified abdominal pain: Secondary | ICD-10-CM | POA: Insufficient documentation

## 2016-05-26 DIAGNOSIS — Z5321 Procedure and treatment not carried out due to patient leaving prior to being seen by health care provider: Secondary | ICD-10-CM | POA: Insufficient documentation

## 2016-05-26 LAB — POCT PREGNANCY, URINE: Preg Test, Ur: NEGATIVE

## 2016-05-26 LAB — URINALYSIS, ROUTINE W REFLEX MICROSCOPIC
BILIRUBIN URINE: NEGATIVE
GLUCOSE, UA: NEGATIVE mg/dL
Hgb urine dipstick: NEGATIVE
Ketones, ur: NEGATIVE mg/dL
LEUKOCYTES UA: NEGATIVE
NITRITE: NEGATIVE
PROTEIN: NEGATIVE mg/dL
Specific Gravity, Urine: 1.011 (ref 1.005–1.030)
pH: 5 (ref 5.0–8.0)

## 2016-05-26 NOTE — Progress Notes (Signed)
Pt notified registration that she was leaving. Pt left AMA without signing form.

## 2016-05-26 NOTE — MAU Note (Signed)
Pt presents to MAU with right sided abdominal pain. Pain started last night and has not subsided. Reports its worse when she bends. Denies taking anything for pain.

## 2016-05-27 ENCOUNTER — Emergency Department (HOSPITAL_COMMUNITY): Payer: Self-pay

## 2016-05-27 ENCOUNTER — Emergency Department (HOSPITAL_COMMUNITY)
Admission: EM | Admit: 2016-05-27 | Discharge: 2016-05-27 | Disposition: A | Discharge status: Home / Self Care | Payer: Self-pay | Attending: Emergency Medicine | Admitting: Emergency Medicine

## 2016-05-27 ENCOUNTER — Encounter (HOSPITAL_COMMUNITY): Payer: Self-pay | Admitting: Emergency Medicine

## 2016-05-27 DIAGNOSIS — R1011 Right upper quadrant pain: Secondary | ICD-10-CM | POA: Insufficient documentation

## 2016-05-27 LAB — CBC WITH DIFFERENTIAL/PLATELET
BASOS ABS: 0 10*3/uL (ref 0.0–0.1)
Basophils Relative: 0 %
EOS PCT: 4 %
Eosinophils Absolute: 0.2 10*3/uL (ref 0.0–0.7)
HCT: 35.6 % — ABNORMAL LOW (ref 36.0–46.0)
Hemoglobin: 11.7 g/dL — ABNORMAL LOW (ref 12.0–15.0)
LYMPHS PCT: 35 %
Lymphs Abs: 1.9 10*3/uL (ref 0.7–4.0)
MCH: 30.5 pg (ref 26.0–34.0)
MCHC: 32.9 g/dL (ref 30.0–36.0)
MCV: 92.7 fL (ref 78.0–100.0)
MONO ABS: 0.4 10*3/uL (ref 0.1–1.0)
MONOS PCT: 8 %
Neutro Abs: 2.8 10*3/uL (ref 1.7–7.7)
Neutrophils Relative %: 53 %
PLATELETS: 264 10*3/uL (ref 150–400)
RBC: 3.84 MIL/uL — ABNORMAL LOW (ref 3.87–5.11)
RDW: 12.4 % (ref 11.5–15.5)
WBC: 5.4 10*3/uL (ref 4.0–10.5)

## 2016-05-27 LAB — COMPREHENSIVE METABOLIC PANEL
ALBUMIN: 3.3 g/dL — AB (ref 3.5–5.0)
ALK PHOS: 41 U/L (ref 38–126)
ALT: 15 U/L (ref 14–54)
ANION GAP: 5 (ref 5–15)
AST: 17 U/L (ref 15–41)
BILIRUBIN TOTAL: 0.6 mg/dL (ref 0.3–1.2)
BUN: 5 mg/dL — ABNORMAL LOW (ref 6–20)
CALCIUM: 8.8 mg/dL — AB (ref 8.9–10.3)
CO2: 27 mmol/L (ref 22–32)
Chloride: 106 mmol/L (ref 101–111)
Creatinine, Ser: 0.73 mg/dL (ref 0.44–1.00)
Glucose, Bld: 89 mg/dL (ref 65–99)
POTASSIUM: 4 mmol/L (ref 3.5–5.1)
Sodium: 138 mmol/L (ref 135–145)
TOTAL PROTEIN: 7.4 g/dL (ref 6.5–8.1)

## 2016-05-27 LAB — URINALYSIS, ROUTINE W REFLEX MICROSCOPIC
BILIRUBIN URINE: NEGATIVE
Glucose, UA: NEGATIVE mg/dL
HGB URINE DIPSTICK: NEGATIVE
KETONES UR: NEGATIVE mg/dL
Leukocytes, UA: NEGATIVE
NITRITE: NEGATIVE
Protein, ur: NEGATIVE mg/dL
Specific Gravity, Urine: 1.017 (ref 1.005–1.030)
pH: 6 (ref 5.0–8.0)

## 2016-05-27 LAB — LIPASE, BLOOD: Lipase: 23 U/L (ref 11–51)

## 2016-05-27 LAB — I-STAT BETA HCG BLOOD, ED (MC, WL, AP ONLY)

## 2016-05-27 NOTE — Discharge Instructions (Signed)
Your labs and ultrasound today were normal. [facility] with [facility]-- call to make an appt. Return to the ED for new or worsening symptoms.

## 2016-05-27 NOTE — ED Provider Notes (Signed)
[facility] Provider Note   CSN: [id number] Arrival date & time: 05/27/16  4098     History   Chief Complaint Chief Complaint  Patient presents with  . Abdominal Pain    HPI Jessica Fernandez is a 36 y.o. female.  The history is provided by the patient and medical records.  Abdominal Pain      36 year old female presenting to the ED with abdominal pain. Reports this is been ongoing for the past 3 days. Reports initially pain was generalized, aching in nature but now is only localized to the right upper quadrant. States she feels like it is in her ribs as well. She denies any nausea or vomiting but does report decreased appetite. Pain is not necessarily exacerbated by eating. No diarrhea.  Reports she was told in the past that she had issues with her gallbladder, but was given medication and it "got better". No prior abdominal surgeries.  She's not tried any medications for her symptoms.  No chest pain or SOB.  No cardiac hx.  History reviewed. No pertinent past medical history.  There are no active problems to display for this patient.   History reviewed. No pertinent surgical history.  OB History    No data available       Home Medications    Prior to Admission medications   Medication Sig Start Date End Date Taking? Authorizing Provider  ibuprofen (ADVIL,MOTRIN) 800 MG tablet Take 1 tablet (800 mg total) by mouth every 8 (eight) hours as needed for mild pain or moderate pain. 02/14/14   Trixie Dredge, PA-C  naproxen sodium (ANAPROX) 220 MG tablet Take 660 mg by mouth daily as needed (pain).    Historical Provider, MD  Pseudoeph-Doxylamine-DM-APAP (NYQUIL PO) Take 30 mLs by mouth at bedtime as needed (headache).    Historical Provider, MD    Family History No family history on file.  Social History Social History  Substance Use Topics  . Smoking status: Not on file  . Smokeless tobacco: Not on file  . Alcohol use Not on file     Allergies   Patient has no  known allergies.   Review of Systems Review of Systems  Gastrointestinal: Positive for abdominal pain.  All other systems reviewed and are negative.    Physical Exam Updated Vital Signs BP 109/65 (BP Location: Right Arm)   Pulse 70   Temp 98.1 F (36.7 C) (Oral)   Resp 15   Ht 5' (1.524 m)   Wt 67.1 kg   LMP 05/18/2016 (Approximate)   SpO2 98%   BMI 28.90 kg/m   Physical Exam  Constitutional: She is oriented to person, place, and time. She appears well-developed and well-nourished.  HENT:  Head: Normocephalic and atraumatic.  Mouth/Throat: Oropharynx is clear and moist.  Eyes: Conjunctivae and EOM are normal. Pupils are equal, round, and reactive to light.  Neck: Normal range of motion.  Cardiovascular: Normal rate, regular rhythm and normal heart sounds.   Pulmonary/Chest: Effort normal and breath sounds normal. No respiratory distress. She has no wheezes.  Abdominal: Soft. Bowel sounds are normal. There is tenderness in the right upper quadrant. There is no CVA tenderness.    Tenderness to palpation in the right upper quadrant without definitive Murphy sign, normal bowel sounds, no distention  Musculoskeletal: Normal range of motion.  Neurological: She is alert and oriented to person, place, and time.  Skin: Skin is warm and dry.  Psychiatric: She has a normal mood and affect.  Nursing  note and vitals reviewed.    ED Treatments / Results  Labs (all labs ordered are listed, but only abnormal results are displayed) Labs Reviewed  CBC WITH DIFFERENTIAL/PLATELET - Abnormal; Notable for the following:       Result Value   RBC 3.84 (*)    Hemoglobin 11.7 (*)    HCT 35.6 (*)    All other components within normal limits  COMPREHENSIVE METABOLIC PANEL - Abnormal; Notable for the following:    BUN 5 (*)    Calcium 8.8 (*)    Albumin 3.3 (*)    All other components within normal limits  LIPASE, BLOOD  URINALYSIS, ROUTINE W REFLEX MICROSCOPIC  I-STAT BETA HCG  BLOOD, ED ([facility], [facility], [facility] ONLY)    EKG  EKG Interpretation None       Radiology Koreas Abdomen Limited Ruq  Result Date: 05/27/2016 CLINICAL DATA:  Right upper quadrant pain for several days EXAM: US ABDOMEN LIMITED - RIGHT UPPER QUADRANT COMPARISON:  None. FINDINGS: Gallbladder: No gallstones or wall thickening visualized. No sonographic Murphy sign noted by sonographer. Common bile duct: Diameter: 3.3 mm Liver: No focal lesion identified. Within normal limits in parenchymal echogenicity. IMPRESSION: No acute abnormality noted. Electronically Signed   By: Alcide CleverMark  Lukens M.D.   On: 05/27/2016 09:55    Procedures Procedures (including critical care time)  Medications Ordered in ED Medications - No data to display   Initial Impression / Assessment and Plan / ED Course  I have reviewed the triage vital signs and the nursing notes.  Pertinent labs & imaging results that were available during my care of the patient were reviewed by me and considered in my medical decision making (see chart for details).  36 year old female here with right upper quadrant abdominal pain for the past 3 days. She is afebrile and nontoxic. She has some mild tenderness in the right upper quadrant without definitive Murphy sign. She has no distention, bowel sounds are normal. Labwork is very reassuring. UA without any signs of infection.  Right upper quadrant ultrasound obtained, no acute findings.  No chest pain or SOB to suggest atypical ACS.  Patient has not required any pain medication in the ED. She has been sleeping comfortably on the ED stretcher. Her vitals remained stable. Feel she is stable for discharge. She does not currently have a PCP or any insurance, so we'll have her follow-up with wellness clinic.  Discussed plan with patient, she acknowledged understanding and agreed with plan of care.  Return precautions given for new or worsening symptoms.   Final Clinical Impressions(s) / ED Diagnoses   Final  diagnoses:  RUQ pain  Right upper quadrant abdominal pain    New Prescriptions Discharge Medication List as of 05/27/2016 12:02 PM       Garlon HatchetLisa M Starkisha Tullis, PA-C 05/27/16 1328    Cathren LaineKevin Steinl, MD 05/27/16 1620

## 2016-05-27 NOTE — ED Notes (Signed)
EDP at bedside  

## 2016-05-27 NOTE — ED Notes (Signed)
Patient transported to US 

## 2016-05-27 NOTE — ED Triage Notes (Signed)
Pt c/o abd pain, started Wednesday, worse last night-- right upper quad, denies any other symptoms. [facility] not get [facility].

## 2016-11-11 ENCOUNTER — Ambulatory Visit (HOSPITAL_COMMUNITY)
Admission: EM | Admit: 2016-11-11 | Discharge: 2016-11-11 | Disposition: A | Discharge status: Home / Self Care | Payer: BLUE CROSS/BLUE SHIELD | Attending: Family | Admitting: Family

## 2016-11-11 ENCOUNTER — Encounter (HOSPITAL_COMMUNITY): Payer: Self-pay | Admitting: Emergency Medicine

## 2016-11-11 DIAGNOSIS — L299 Pruritus, unspecified: Secondary | ICD-10-CM | POA: Diagnosis not present

## 2016-11-11 DIAGNOSIS — L509 Urticaria, unspecified: Secondary | ICD-10-CM

## 2016-11-11 MED ORDER — HYDROXYZINE HCL 25 MG PO TABS: 25.0000 mg | ORAL_TABLET | Freq: Three times a day (TID) | ORAL | 0 refills | Status: DC | PRN

## 2016-11-11 NOTE — ED Provider Notes (Signed)
[facility]    CSN: [id number] Arrival date & time: 11/11/16  1131     History   Chief Complaint Chief Complaint  Patient presents with  . skin irritation    HPI Jessica Fernandez is a 36 y.o. female.   36 year old female presents with intermittent itchiness of her skin of her entire body. Will start around her lower arms and then go to her trunk and lower legs as well as her back and neck. Will scratch until "bumps" appear and then they disappear within minutes to hours. Has been occurring for the past 2 months. No other symptoms. No distinct change in soaps, detergents or new foods. No other family members ill or with similar symptoms. No chronic health issues- takes no daily medication.    The history is provided by the patient.    History reviewed. No pertinent past medical history.  There are no active problems to display for this patient.   History reviewed. No pertinent surgical history.  OB History    No data available       Home Medications    Prior to Admission medications   Medication Sig Start Date End Date Taking? Authorizing Provider  hydrOXYzine (ATARAX/VISTARIL) 25 MG tablet Take 1 tablet (25 mg total) by mouth every 8 (eight) hours as needed for itching. 11/11/16   Sudie Grumbling, NP    Family History History reviewed. No pertinent family history.  Social History Social History  Substance Use Topics  . Smoking status: Never Smoker  . Smokeless tobacco: Never Used  . Alcohol use No     Allergies   Patient has no known allergies.   Review of Systems Review of Systems  Constitutional: Negative for activity change, appetite change, chills, diaphoresis, fatigue, fever and unexpected weight change.  HENT: Negative for congestion, mouth sores, nosebleeds, postnasal drip, rhinorrhea, sinus pain, sinus pressure, sneezing, sore throat and trouble swallowing.   Eyes: Negative for pain, discharge, redness and itching.  Respiratory: Negative  for cough, chest tightness, shortness of breath and wheezing.   Cardiovascular: Negative for chest pain, palpitations and leg swelling.  Gastrointestinal: Negative for abdominal pain, diarrhea, nausea and vomiting.  Genitourinary: Negative for difficulty urinating, dysuria and flank pain.  Musculoskeletal: Negative for arthralgias, back pain, joint swelling, myalgias, neck pain and neck stiffness.  Skin: Positive for rash. Negative for color change and wound.  Allergic/Immunologic: Negative for immunocompromised state.  Neurological: Negative for dizziness, tremors, seizures, syncope, speech difficulty, weakness, light-headedness, numbness and headaches.  Hematological: Negative for adenopathy. Does not bruise/bleed easily.     Physical Exam Triage Vital Signs ED Triage Vitals [11/11/16 1216]  Enc Vitals Group     BP 99/70     Pulse Rate 81     Resp      Temp 98.6 F (37 C)     Temp Source Oral     SpO2 99 %     Weight      Height      Head Circumference      Peak Flow      Pain Score      Pain Loc      Pain Edu?      Excl. in GC?    No data found.   Updated Vital Signs BP 99/70 (BP Location: Left Arm)   Pulse 81   Temp 98.6 F (37 C) (Oral)   LMP 10/25/2016 (Approximate)   SpO2 99%   Visual Acuity Right Eye Distance:  Left Eye Distance:   Bilateral Distance:    Right Eye Near:   Left Eye Near:    Bilateral Near:     Physical Exam  Constitutional: She is oriented to person, place, and time. She appears well-developed and well-nourished. No distress.  HENT:  Head: Normocephalic and atraumatic.  Right Ear: Hearing and external ear normal.  Left Ear: Hearing and external ear normal.  Nose: Nose normal.  Mouth/Throat: Uvula is midline, oropharynx is clear and moist and mucous membranes are normal.  Eyes: Conjunctivae and EOM are normal.  Neck: Normal range of motion.  Cardiovascular: Normal rate and regular rhythm.   Pulmonary/Chest: Effort normal and  breath sounds normal. No respiratory distress. She has no decreased breath sounds. She has no wheezes. She has no rhonchi.  Musculoskeletal: Normal range of motion.  Neurological: She is alert and oriented to person, place, and time.  Skin: Skin is warm, dry and intact. Rash noted. Rash is urticarial.  No distinct rash present on exam but one lesion that may be slight urticarial appearing on lower arm and few others around left side of neck. No erythema. No tenderness.   Psychiatric: She has a normal mood and affect. Her behavior is normal.     UC Treatments / Results  Labs (all labs ordered are listed, but only abnormal results are displayed) Labs Reviewed - No data to display  EKG  EKG Interpretation None       Radiology No results found.  Procedures Procedures (including critical care time)  Medications Ordered in UC Medications - No data to display   Initial Impression / Assessment and Plan / UC Course  I have reviewed the triage vital signs and the nursing notes.  Pertinent labs & imaging results that were available during my care of the patient were reviewed by me and considered in my medical decision making (see chart for details).     Discussed with patient that numerous causes of itching and hives. May be due to foods, topicals or environmental. Recommend trial Vistaril 25mg  at bedtime to help with itching. May take 1 during the day if needed- will cause drowsiness. Recommend contact Dermatologist today to schedule appointment for further evaluation of current symptoms and possible allergy testing.   Final Clinical Impressions(s) / UC Diagnoses   Final diagnoses:  Urticaria of unknown origin  Pruritus    New Prescriptions Discharge Medication List as of 11/11/2016  1:24 PM    START taking these medications   Details  hydrOXYzine (ATARAX/VISTARIL) 25 MG tablet Take 1 tablet (25 mg total) by mouth every 8 (eight) hours as needed for itching., Starting Fri  11/11/2016, Normal         Controlled Substance Prescriptions [place] Controlled Substance Registry consulted? Not Applicable   Sudie Grumbling, NP 11/12/16 1132

## 2016-11-11 NOTE — Discharge Instructions (Signed)
Recommend start Vistaril 25mg  every evening to help with itching. May also take 1 during the day if needed. Can cause drowsiness. Recommend contact Dermatologist today to schedule appointment for further evaluation for possible allergies.

## 2016-11-11 NOTE — ED Triage Notes (Signed)
Pt reports intermittent itching anywhere on her body that results in small bumps.  She has no current breakout, but states it comes and goes and will occur on different parts of her body.

## 2019-09-26 ENCOUNTER — Other Ambulatory Visit: Payer: Self-pay | Admitting: Obstetrics and Gynecology

## 2019-09-26 ENCOUNTER — Ambulatory Visit
Admission: RE | Admit: 2019-09-26 | Discharge: 2019-09-26 | Disposition: A | Discharge status: Home / Self Care | Payer: No Typology Code available for payment source | Source: Ambulatory Visit | Attending: Obstetrics and Gynecology | Admitting: Obstetrics and Gynecology

## 2019-09-26 DIAGNOSIS — R7611 Nonspecific reaction to tuberculin skin test without active tuberculosis: Secondary | ICD-10-CM

## 2020-03-21 NOTE — L&D Delivery Note (Signed)
Obstetrical Delivery Note   Date of Delivery:   03/18/2021 Primary OB:   [facility] Department Gestational Age/EDD: [redacted]w[redacted]d Reason for Admission: IOL for AMA Antepartum complications: AMA  Delivered By:   Cornelia Copa. MD Delivery Type:   spontaneous vaginal delivery  Delivery Details:   I was called to see patient for wanting to push and was just 9.5cm. On arrival, fetal head starting to deliver and patient had an uncomplicated to delivery. Anesthesia:    none Intrapartum complications: None GBS:    Positive and she was adequately treated Laceration:    none Episiotomy:    none Rectal exam:   deferred Placenta:    Delivered and expressed via active management. Intact: no. To pathology: no.  Delayed Cord Clamping: yes Estimated Blood Loss:  34mL  Baby:    Liveborn female, APGARs 9/9, weight 3300gm  Cornelia Copa. MD Attending Center for [employer] [facility])

## 2020-06-16 ENCOUNTER — Other Ambulatory Visit: Payer: Self-pay

## 2020-06-16 ENCOUNTER — Ambulatory Visit (HOSPITAL_COMMUNITY)
Admission: EM | Admit: 2020-06-16 | Discharge: 2020-06-16 | Disposition: A | Discharge status: Home / Self Care | Payer: 59 | Attending: Internal Medicine | Admitting: Internal Medicine

## 2020-06-16 DIAGNOSIS — Z20822 Contact with and (suspected) exposure to covid-19: Secondary | ICD-10-CM | POA: Insufficient documentation

## 2020-06-16 DIAGNOSIS — Z0189 Encounter for other specified special examinations: Secondary | ICD-10-CM | POA: Diagnosis not present

## 2020-06-16 LAB — SARS CORONAVIRUS 2 (TAT 6-24 HRS): SARS Coronavirus 2: NEGATIVE

## 2020-06-16 NOTE — ED Triage Notes (Signed)
requesting covid test for traveling

## 2020-09-24 ENCOUNTER — Ambulatory Visit (HOSPITAL_COMMUNITY)
Admission: EM | Admit: 2020-09-24 | Discharge: 2020-09-24 | Disposition: A | Discharge status: Home / Self Care | Payer: 59 | Attending: Family Medicine | Admitting: Family Medicine

## 2020-09-24 ENCOUNTER — Encounter (HOSPITAL_COMMUNITY): Payer: Self-pay

## 2020-09-24 ENCOUNTER — Other Ambulatory Visit: Payer: Self-pay

## 2020-09-24 DIAGNOSIS — Z3201 Encounter for pregnancy test, result positive: Secondary | ICD-10-CM | POA: Diagnosis present

## 2020-09-24 DIAGNOSIS — N760 Acute vaginitis: Secondary | ICD-10-CM | POA: Diagnosis present

## 2020-09-24 LAB — POCT URINALYSIS DIPSTICK, ED / UC
Bilirubin Urine: NEGATIVE
Glucose, UA: NEGATIVE mg/dL
Hgb urine dipstick: NEGATIVE
Ketones, ur: NEGATIVE mg/dL
Nitrite: NEGATIVE
Protein, ur: NEGATIVE mg/dL
Specific Gravity, Urine: >=1.03 (ref 1.005–1.030)
Urobilinogen, UA: 0.2 mg/dL (ref 0.0–1.0)
pH: 5.5 (ref 5.0–8.0)

## 2020-09-24 LAB — POC URINE PREG, ED: Preg Test, Ur: POSITIVE — AB

## 2020-09-24 NOTE — ED Provider Notes (Signed)
[facility]    CSN: [id number] Arrival date & time: 09/24/20  0807      History   Chief Complaint Chief Complaint  Patient presents with   Possible Pregnancy   Vaginal Discharge    HPI Jessica Fernandez is a 40 y.o. female.   Patient presenting today requesting pregnancy testing.  States LMP was 06/30/2020, typically regular menstrual cycles once a month.  She states that she has been having fatigue, bloating and lower abdominal fullness but otherwise feeling well overall.  States she did not know how to take a pregnancy test over-the-counter and is requesting one today.  Denies vaginal bleeding, pelvic pain or cramping, nausea, vomiting, fevers.  She did start having some vaginal itching and irritation yesterday and wants to be checked out for that as well.  No new products, new sexual partners.  Does not have an OB/GYN at this time.   History reviewed. No pertinent past medical history.  There are no problems to display for this patient.   History reviewed. No pertinent surgical history.  OB History   No obstetric history on file.      Home Medications    Prior to Admission medications   Medication Sig Start Date End Date Taking? Authorizing Provider  hydrOXYzine (ATARAX/VISTARIL) 25 MG tablet Take 1 tablet (25 mg total) by mouth every 8 (eight) hours as needed for itching. 11/11/16   Sudie Grumbling, NP    Family History History reviewed. No pertinent family history.  Social History Social History   Tobacco Use   Smoking status: Never   Smokeless tobacco: Never  Substance Use Topics   Alcohol use: No   Drug use: No     Allergies   Patient has no known allergies.   Review of Systems Review of Systems Per HPI  Physical Exam Triage Vital Signs ED Triage Vitals [09/24/20 0829]  Enc Vitals Group     BP 121/83     Pulse Rate 85     Resp 16     Temp 98.9 F (37.2 C)     Temp Source Oral     SpO2 97 %     Weight      Height      Head  Circumference      Peak Flow      Pain Score 0     Pain Loc      Pain Edu?      Excl. in GC?    No data found.  Updated Vital Signs BP 121/83 (BP Location: Right Arm)   Pulse 85   Temp 98.9 F (37.2 C) (Oral)   Resp 16   LMP 06/30/2020   SpO2 97%   Visual Acuity Right Eye Distance:   Left Eye Distance:   Bilateral Distance:    Right Eye Near:   Left Eye Near:    Bilateral Near:     Physical Exam Vitals and nursing note reviewed.  Constitutional:      Appearance: Normal appearance. She is not ill-appearing.  HENT:     Head: Atraumatic.     Mouth/Throat:     Mouth: Mucous membranes are moist.     Pharynx: Oropharynx is clear.  Eyes:     Extraocular Movements: Extraocular movements intact.     Conjunctiva/sclera: Conjunctivae normal.  Cardiovascular:     Rate and Rhythm: Normal rate and regular rhythm.     Heart sounds: Normal heart sounds.  Pulmonary:     Effort: Pulmonary  effort is normal. No respiratory distress.     Breath sounds: Normal breath sounds. No wheezing or rales.  Abdominal:     General: Bowel sounds are normal. There is no distension.     Palpations: Abdomen is soft.     Tenderness: There is no abdominal tenderness. There is no guarding.  Genitourinary:    Comments: GU exam deferred, self swab performed Musculoskeletal:        General: Normal range of motion.     Cervical back: Normal range of motion and neck supple.  Skin:    General: Skin is warm and dry.  Neurological:     Mental Status: She is alert and oriented to person, place, and time.  Psychiatric:        Mood and Affect: Mood normal.        Thought Content: Thought content normal.        Judgment: Judgment normal.     UC Treatments / Results  Labs (all labs ordered are listed, but only abnormal results are displayed) Labs Reviewed  POCT URINALYSIS DIPSTICK, ED / UC - Abnormal; Notable for the following components:      Result Value   Leukocytes,Ua SMALL (*)    All other  components within normal limits  POC URINE PREG, ED - Abnormal; Notable for the following components:   Preg Test, Ur POSITIVE (*)    All other components within normal limits  CERVICOVAGINAL ANCILLARY ONLY    EKG   Radiology No results found.  Procedures Procedures (including critical care time)  Medications Ordered in UC Medications - No data to display  Initial Impression / Assessment and Plan / UC Course  I have reviewed the triage vital signs and the nursing notes.  Pertinent labs & imaging results that were available during my care of the patient were reviewed by me and considered in my medical decision making (see chart for details).     Vitals and exam reassuring, urinalysis without evidence of urinary tract infection, urine pregnancy positive.  Cytology swab pending, will treat based on these results.  Discussed prenatal vitamins and good prenatal care, women's health information given for follow-up as soon as possible.  She is agreeable to plan.  Final Clinical Impressions(s) / UC Diagnoses   Final diagnoses:  Positive pregnancy test  Acute vaginitis   Discharge Instructions   None    ED Prescriptions   None    PDMP not reviewed this encounter.   Roosvelt Maser [place], [place] 09/24/20 [phone]

## 2020-09-24 NOTE — ED Triage Notes (Signed)
Pt present possibly pregnant and vaginal irration. Pt states the last period she had was back in April. Her vaginal irration started last night with some itching.

## 2020-09-25 ENCOUNTER — Telehealth (HOSPITAL_COMMUNITY): Payer: Self-pay | Admitting: Emergency Medicine

## 2020-09-25 LAB — CERVICOVAGINAL ANCILLARY ONLY
Bacterial Vaginitis (gardnerella): NEGATIVE
Candida Glabrata: NEGATIVE
Candida Vaginitis: POSITIVE — AB
Chlamydia: NEGATIVE
Comment: NEGATIVE
Comment: NEGATIVE
Comment: NEGATIVE
Comment: NEGATIVE
Comment: NEGATIVE
Comment: NORMAL
Neisseria Gonorrhea: NEGATIVE
Trichomonas: NEGATIVE

## 2020-09-25 MED ORDER — TERCONAZOLE 0.4 % VA CREA: 1.0000 | TOPICAL_CREAM | Freq: Every day | VAGINAL | 0 refills | Status: DC

## 2020-09-28 ENCOUNTER — Telehealth (HOSPITAL_COMMUNITY): Payer: Self-pay | Admitting: Emergency Medicine

## 2020-09-28 MED ORDER — TERCONAZOLE 0.4 % VA CREA: 1.0000 | TOPICAL_CREAM | Freq: Every day | VAGINAL | 0 refills | Status: AC

## 2020-10-19 ENCOUNTER — Other Ambulatory Visit: Payer: Self-pay | Admitting: Family Medicine

## 2020-10-19 LAB — OB RESULTS CONSOLE HEPATITIS B SURFACE ANTIGEN: Hepatitis B Surface Ag: NEGATIVE

## 2020-10-19 LAB — OB RESULTS CONSOLE RUBELLA ANTIBODY, IGM: Rubella: IMMUNE

## 2020-10-19 LAB — HEPATITIS C ANTIBODY: HCV Ab: NEGATIVE

## 2020-10-20 ENCOUNTER — Other Ambulatory Visit: Payer: Self-pay

## 2020-11-06 ENCOUNTER — Encounter: Payer: Self-pay | Admitting: *Deleted

## 2020-11-09 ENCOUNTER — Other Ambulatory Visit: Payer: Self-pay | Admitting: Family Medicine

## 2020-11-09 DIAGNOSIS — Z363 Encounter for antenatal screening for malformations: Secondary | ICD-10-CM

## 2020-11-10 ENCOUNTER — Other Ambulatory Visit: Payer: Self-pay | Admitting: Maternal & Fetal Medicine

## 2020-11-10 ENCOUNTER — Ambulatory Visit: Payer: 59

## 2020-11-10 ENCOUNTER — Ambulatory Visit: Payer: 59 | Admitting: *Deleted

## 2020-11-10 ENCOUNTER — Ambulatory Visit: Payer: Self-pay

## 2020-11-10 ENCOUNTER — Other Ambulatory Visit: Payer: Self-pay

## 2020-11-10 ENCOUNTER — Ambulatory Visit (HOSPITAL_BASED_OUTPATIENT_CLINIC_OR_DEPARTMENT_OTHER): Payer: 59

## 2020-11-10 ENCOUNTER — Ambulatory Visit: Payer: 59 | Attending: Advanced Practice Midwife

## 2020-11-10 VITALS — BP 108/62 | HR 92

## 2020-11-10 DIAGNOSIS — O09292 Supervision of pregnancy with other poor reproductive or obstetric history, second trimester: Secondary | ICD-10-CM | POA: Diagnosis not present

## 2020-11-10 DIAGNOSIS — O09512 Supervision of elderly primigravida, second trimester: Secondary | ICD-10-CM

## 2020-11-10 DIAGNOSIS — Z362 Encounter for other antenatal screening follow-up: Secondary | ICD-10-CM

## 2020-11-10 DIAGNOSIS — E669 Obesity, unspecified: Secondary | ICD-10-CM

## 2020-11-10 DIAGNOSIS — O09522 Supervision of elderly multigravida, second trimester: Secondary | ICD-10-CM

## 2020-11-10 DIAGNOSIS — O99212 Obesity complicating pregnancy, second trimester: Secondary | ICD-10-CM | POA: Diagnosis not present

## 2020-11-10 DIAGNOSIS — Z363 Encounter for antenatal screening for malformations: Secondary | ICD-10-CM | POA: Insufficient documentation

## 2020-11-10 DIAGNOSIS — Z3A20 20 weeks gestation of pregnancy: Secondary | ICD-10-CM

## 2020-11-10 NOTE — Progress Notes (Addendum)
Referring Provider:  Wandra Mannan* Length of Consultation: 30 minutes  Jessica Fernandez was referred to [facility] for genetic counseling because of advanced maternal age.  The patient will be 40 years old at the time of delivery.  This note summarizes the information we discussed.  The patient did not have a support person present with her today.  We explained that the chance of a chromosome abnormality increases with maternal age.  Chromosomes and examples of chromosome problems were reviewed.  Humans typically have 46 chromosomes in each cell, with half passed through each sperm and egg.  Any change in the number or structure of chromosomes can increase the risk of problems in the physical and mental development of a pregnancy.   Based upon age of the patient and the current gestational age, the chance of any chromosome abnormality was 1 in 77. The chance of Down syndrome, the most common chromosome problem associated with maternal age, was 1 in 47.  The risk of chromosome problems is in addition to the 3% general population risk for birth defects and mental retardation.  The greatest chance, of course, is that the baby would be born in good health.  We discussed the following prenatal screening and testing options for this pregnancy:  Cell free fetal DNA testing from maternal blood may be used to determine whether or not the baby may have Down syndrome, trisomy 63, or trisomy 70.  This test utilizes a maternal blood sample and DNA sequencing technology to isolate circulating cell free fetal DNA from maternal plasma.  The fetal DNA can then be analyzed for DNA sequences that are derived from the three most common chromosomes involved in aneuploidy, chromosomes 13, 18, and 21.  If the overall amount of DNA is greater than the expected level for any of these chromosomes, aneuploidy is suspected.  While we do not consider it a replacement for invasive testing and karyotype analysis, a  negative result from this testing would be reassuring, though not a guarantee of a normal chromosome complement for the baby.  An abnormal result is certainly suggestive of an abnormal chromosome complement, though we would still recommend amniocentesis to confirm any findings from this testing.  Maternal serum marker screening, a blood test that measures pregnancy proteins, can provide risk assessments for Down syndrome, trisomy 18, and open neural tube defects (spina bifida, anencephaly). Because it does not directly examine the fetus, it cannot positively diagnose or rule out these problems. Prior Maternal serum screening was performed through her OB and was within normal limits.  The chance for open spina bifida was 1 in 5,000. The chance for Down syndrome was reduced to 1 in 1,249 and the chance for trisomy 18 to 1 in 1,186.  Targeted ultrasound uses high frequency sound waves to create an image of the developing fetus.  An ultrasound is often recommended as a routine means of evaluating the pregnancy.  It is also used to screen for fetal anatomy problems (for example, a heart defect) that might be suggestive of a chromosomal or other abnormality.   Amniocentesis involves the removal of a small amount of amniotic fluid from the sac surrounding the fetus with the use of a thin needle inserted through the maternal abdomen and uterus.  Ultrasound guidance is used throughout the procedure.  Fetal cells from amniotic fluid are directly evaluated and > 99.5% of chromosome problems and > 98% of open neural tube defects can be detected. This procedure is generally performed after  the 15th week of pregnancy.  The main risks to this procedure include complications leading to miscarriage in less than 1 in 200 cases (0.5%).   Cystic Fibrosis and Spinal Muscular Atrophy (SMA) screening were also discussed with the patient. Both conditions are recessive, which means that both parents must be carriers in order to  have a child with the disease.  Cystic fibrosis (CF) is one of the most common genetic conditions in persons of Caucasian ancestry.  This condition occurs in approximately 1 in 2,500 Caucasian persons and results in thickened secretions in the lungs, digestive, and reproductive systems.  For a baby to be at risk for having CF, both of the parents must be carriers for this condition.  Approximately 1 in 74 Caucasian persons is a carrier for CF.  Current carrier testing looks for the most common mutations in the gene for CF and can detect approximately 90% of carriers in the Caucasian population.  This means that the carrier screening can greatly reduce, but cannot eliminate, the chance for an individual to have a child with CF.  If an individual is found to be a carrier for CF, then carrier testing would be available for the partner. As part of [place]'s newborn screening profile, all babies born in the state of [place] will have a two-tier screening process.  Specimens are first tested to determine the concentration of immunoreactive trypsinogen (IRT).  The top 5% of specimens with the highest IRT values then undergo DNA testing using a panel of over 40 common CF mutations. SMA is a neurodegenerative disorder that leads to atrophy of skeletal muscle and overall weakness.  This condition is also more prevalent in the Caucasian population, with 1 in 40-1 in 60 persons being a carrier and 1 in 6,000-1 in 10,000 children being affected.  There are multiple forms of the disease, with some causing death in infancy to other forms with survival into adulthood.  The genetics of SMA is complex, but carrier screening can detect up to 95% of carriers in the Caucasian population.  Similar to CF, a negative result can greatly reduce, but cannot eliminate, the chance to have a child with SMA. Hemoglobinopathy screening was also made available to the patient. Prior CF and hemoglobinopathy screening was performed  through her OB and was within normal limits.   We obtained a detailed family history and pregnancy history.  This is the fifth pregnancy for the patient, the first with her current partner.  Her first daughter, who is now 5 years old, is deaf and cannot speak.  She was born in [place] during war times with little medical care as an infant.  Since coming to the [place], the patient has been told that she was not born deaf, but developed hearing loss possibly due to infections  While there are many inherited types of hearing loss, if her condition is related to illness, then we do not expect other family members to be at increased risk for deafness.  The remainder of the family history is unremarkable for birth defects, developmental delays, recurrent pregnancy loss or known chromosome abnormalities.  Jessica Fernandez reported no complications in this pregnancy or exposures to medications, alcohol, tobacco or recreational drugs.  An ultrasound was performed at the time of the visit.  The gestational age was consistent with 20 weeks.   No markers of aneuploidy were noted, but it is important to remember that a normal ultrasound does not exclude the possibility of birth defect or  chromosome condition.  Please refer to the ultrasound report for details of that study.  Jessica Fernandez was encouraged to call with questions or concerns.  We can be contacted at [phone].  Plan of Care: MaterniT21 PLUS with SCA drawn today. Jessica Fernandez declined SMA carrier screening and amniocentesis.   Cherly Anderson, MS, CGC

## 2020-11-15 LAB — MATERNIT21 PLUS CORE+SCA
Fetal Fraction: 5
Monosomy X (Turner Syndrome): NOT DETECTED
Result (T21): NEGATIVE
Trisomy 13 (Patau syndrome): NEGATIVE
Trisomy 18 (Edwards syndrome): NEGATIVE
Trisomy 21 (Down syndrome): NEGATIVE
XXX (Triple X Syndrome): NOT DETECTED
XXY (Klinefelter Syndrome): NOT DETECTED
XYY (Jacobs Syndrome): NOT DETECTED

## 2020-11-17 ENCOUNTER — Telehealth: Payer: Self-pay | Admitting: Obstetrics and Gynecology

## 2020-11-17 NOTE — Telephone Encounter (Signed)
The patient was informed of the results of her recent MaterniT21 testing which yielded NEGATIVE results.  The patient's specimen showed DNA consistent with two copies of chromosomes 21, 18 and 13.  The sensitivity for trisomy 27, trisomy 56 and trisomy 27 using this testing are reported as 99.1%, 99.9% and 91.7% respectively.  Thus, while the results of this testing are highly accurate, they are not considered diagnostic at this time.  Should more definitive information be desired, the patient may still consider amniocentesis.   As requested to know by the patient, sex chromosome analysis was included for this sample.  Results are consistent with a female fetus. This is predicted with >99% accuracy. This testing also screens for sex chromosome conditions with greater than 96% accuracy and was negative for those conditions.   Prior quad screening was performed and was negative, showing a chance of less than 1 in 5,000 for neural tube defects.  We may be reached at [phone] with any questions or concerns.  Cherly Anderson, MS, CGC

## 2020-12-10 ENCOUNTER — Encounter: Payer: Self-pay | Admitting: *Deleted

## 2020-12-10 ENCOUNTER — Ambulatory Visit: Payer: 59 | Attending: Maternal & Fetal Medicine

## 2020-12-10 ENCOUNTER — Other Ambulatory Visit: Payer: Self-pay | Admitting: *Deleted

## 2020-12-10 ENCOUNTER — Other Ambulatory Visit: Payer: Self-pay

## 2020-12-10 ENCOUNTER — Ambulatory Visit: Payer: 59 | Admitting: *Deleted

## 2020-12-10 VITALS — BP 108/66 | HR 94

## 2020-12-10 DIAGNOSIS — O09523 Supervision of elderly multigravida, third trimester: Secondary | ICD-10-CM

## 2020-12-10 DIAGNOSIS — O09522 Supervision of elderly multigravida, second trimester: Secondary | ICD-10-CM | POA: Diagnosis present

## 2020-12-10 DIAGNOSIS — Z3A25 25 weeks gestation of pregnancy: Secondary | ICD-10-CM

## 2020-12-10 DIAGNOSIS — E669 Obesity, unspecified: Secondary | ICD-10-CM | POA: Diagnosis not present

## 2020-12-10 DIAGNOSIS — Z362 Encounter for other antenatal screening follow-up: Secondary | ICD-10-CM | POA: Diagnosis present

## 2020-12-10 DIAGNOSIS — O09512 Supervision of elderly primigravida, second trimester: Secondary | ICD-10-CM | POA: Insufficient documentation

## 2020-12-10 DIAGNOSIS — O99212 Obesity complicating pregnancy, second trimester: Secondary | ICD-10-CM | POA: Diagnosis present

## 2020-12-28 LAB — OB RESULTS CONSOLE HIV ANTIBODY (ROUTINE TESTING): HIV: NONREACTIVE

## 2021-01-08 ENCOUNTER — Ambulatory Visit: Payer: 59 | Admitting: *Deleted

## 2021-01-08 ENCOUNTER — Other Ambulatory Visit: Payer: Self-pay | Admitting: *Deleted

## 2021-01-08 ENCOUNTER — Other Ambulatory Visit: Payer: Self-pay

## 2021-01-08 ENCOUNTER — Encounter: Payer: Self-pay | Admitting: *Deleted

## 2021-01-08 ENCOUNTER — Ambulatory Visit: Payer: 59 | Attending: Obstetrics

## 2021-01-08 VITALS — BP 112/59 | HR 87

## 2021-01-08 DIAGNOSIS — E669 Obesity, unspecified: Secondary | ICD-10-CM

## 2021-01-08 DIAGNOSIS — O09523 Supervision of elderly multigravida, third trimester: Secondary | ICD-10-CM

## 2021-01-08 DIAGNOSIS — O09293 Supervision of pregnancy with other poor reproductive or obstetric history, third trimester: Secondary | ICD-10-CM | POA: Diagnosis not present

## 2021-01-08 DIAGNOSIS — O409XX0 Polyhydramnios, unspecified trimester, not applicable or unspecified: Secondary | ICD-10-CM

## 2021-01-08 DIAGNOSIS — Z362 Encounter for other antenatal screening follow-up: Secondary | ICD-10-CM

## 2021-01-08 DIAGNOSIS — Z6833 Body mass index (BMI) 33.0-33.9, adult: Secondary | ICD-10-CM

## 2021-01-08 DIAGNOSIS — Z3A29 29 weeks gestation of pregnancy: Secondary | ICD-10-CM

## 2021-01-08 DIAGNOSIS — O99213 Obesity complicating pregnancy, third trimester: Secondary | ICD-10-CM | POA: Diagnosis not present

## 2021-01-08 DIAGNOSIS — O09299 Supervision of pregnancy with other poor reproductive or obstetric history, unspecified trimester: Secondary | ICD-10-CM

## 2021-02-22 LAB — OB RESULTS CONSOLE GBS: GBS: POSITIVE

## 2021-02-22 LAB — OB RESULTS CONSOLE GC/CHLAMYDIA
Chlamydia: NEGATIVE
Gonorrhea: NEGATIVE

## 2021-02-26 ENCOUNTER — Encounter: Payer: Self-pay | Admitting: *Deleted

## 2021-02-26 ENCOUNTER — Ambulatory Visit: Payer: 59 | Admitting: *Deleted

## 2021-02-26 ENCOUNTER — Other Ambulatory Visit: Payer: Self-pay

## 2021-02-26 ENCOUNTER — Ambulatory Visit: Payer: 59 | Attending: Obstetrics and Gynecology

## 2021-02-26 VITALS — BP 118/70 | HR 93

## 2021-02-26 DIAGNOSIS — O09523 Supervision of elderly multigravida, third trimester: Secondary | ICD-10-CM | POA: Insufficient documentation

## 2021-02-26 DIAGNOSIS — O09293 Supervision of pregnancy with other poor reproductive or obstetric history, third trimester: Secondary | ICD-10-CM | POA: Diagnosis not present

## 2021-02-26 DIAGNOSIS — O09299 Supervision of pregnancy with other poor reproductive or obstetric history, unspecified trimester: Secondary | ICD-10-CM | POA: Diagnosis present

## 2021-02-26 DIAGNOSIS — Z6833 Body mass index (BMI) 33.0-33.9, adult: Secondary | ICD-10-CM | POA: Diagnosis present

## 2021-02-26 DIAGNOSIS — O409XX0 Polyhydramnios, unspecified trimester, not applicable or unspecified: Secondary | ICD-10-CM | POA: Diagnosis present

## 2021-02-26 DIAGNOSIS — Z3A36 36 weeks gestation of pregnancy: Secondary | ICD-10-CM

## 2021-03-05 ENCOUNTER — Other Ambulatory Visit: Payer: Self-pay

## 2021-03-05 ENCOUNTER — Other Ambulatory Visit: Payer: 59

## 2021-03-05 ENCOUNTER — Encounter: Payer: Self-pay | Admitting: *Deleted

## 2021-03-05 ENCOUNTER — Ambulatory Visit: Payer: 59 | Attending: Obstetrics and Gynecology | Admitting: *Deleted

## 2021-03-05 ENCOUNTER — Ambulatory Visit (HOSPITAL_BASED_OUTPATIENT_CLINIC_OR_DEPARTMENT_OTHER): Payer: 59

## 2021-03-05 ENCOUNTER — Ambulatory Visit: Payer: 59

## 2021-03-05 VITALS — BP 106/64 | HR 86

## 2021-03-05 DIAGNOSIS — O09523 Supervision of elderly multigravida, third trimester: Secondary | ICD-10-CM

## 2021-03-05 DIAGNOSIS — Z3A37 37 weeks gestation of pregnancy: Secondary | ICD-10-CM | POA: Diagnosis not present

## 2021-03-05 DIAGNOSIS — O09293 Supervision of pregnancy with other poor reproductive or obstetric history, third trimester: Secondary | ICD-10-CM | POA: Insufficient documentation

## 2021-03-05 DIAGNOSIS — Z6833 Body mass index (BMI) 33.0-33.9, adult: Secondary | ICD-10-CM

## 2021-03-05 DIAGNOSIS — O99213 Obesity complicating pregnancy, third trimester: Secondary | ICD-10-CM | POA: Insufficient documentation

## 2021-03-05 DIAGNOSIS — O403XX0 Polyhydramnios, third trimester, not applicable or unspecified: Secondary | ICD-10-CM | POA: Diagnosis not present

## 2021-03-05 DIAGNOSIS — E669 Obesity, unspecified: Secondary | ICD-10-CM | POA: Insufficient documentation

## 2021-03-05 DIAGNOSIS — O09299 Supervision of pregnancy with other poor reproductive or obstetric history, unspecified trimester: Secondary | ICD-10-CM

## 2021-03-05 DIAGNOSIS — O409XX0 Polyhydramnios, unspecified trimester, not applicable or unspecified: Secondary | ICD-10-CM

## 2021-03-11 ENCOUNTER — Ambulatory Visit: Payer: 59 | Attending: Obstetrics and Gynecology

## 2021-03-11 ENCOUNTER — Encounter: Payer: Self-pay | Admitting: *Deleted

## 2021-03-11 ENCOUNTER — Ambulatory Visit: Payer: 59 | Admitting: *Deleted

## 2021-03-11 ENCOUNTER — Other Ambulatory Visit: Payer: Self-pay

## 2021-03-11 VITALS — BP 107/62 | HR 93

## 2021-03-11 DIAGNOSIS — O09299 Supervision of pregnancy with other poor reproductive or obstetric history, unspecified trimester: Secondary | ICD-10-CM | POA: Insufficient documentation

## 2021-03-11 DIAGNOSIS — O09523 Supervision of elderly multigravida, third trimester: Secondary | ICD-10-CM

## 2021-03-11 DIAGNOSIS — O409XX0 Polyhydramnios, unspecified trimester, not applicable or unspecified: Secondary | ICD-10-CM | POA: Insufficient documentation

## 2021-03-11 DIAGNOSIS — O403XX0 Polyhydramnios, third trimester, not applicable or unspecified: Secondary | ICD-10-CM | POA: Diagnosis not present

## 2021-03-11 DIAGNOSIS — Z6833 Body mass index (BMI) 33.0-33.9, adult: Secondary | ICD-10-CM

## 2021-03-11 DIAGNOSIS — O09293 Supervision of pregnancy with other poor reproductive or obstetric history, third trimester: Secondary | ICD-10-CM

## 2021-03-11 DIAGNOSIS — Z3A38 38 weeks gestation of pregnancy: Secondary | ICD-10-CM

## 2021-03-12 ENCOUNTER — Ambulatory Visit: Payer: 59

## 2021-03-15 ENCOUNTER — Other Ambulatory Visit: Payer: Self-pay | Admitting: Certified Nurse Midwife

## 2021-03-17 ENCOUNTER — Encounter (HOSPITAL_COMMUNITY): Payer: Self-pay | Admitting: Family Medicine

## 2021-03-17 ENCOUNTER — Inpatient Hospital Stay (HOSPITAL_COMMUNITY)
Admission: AD | Admit: 2021-03-17 | Discharge: 2021-03-19 | DRG: 807 | Disposition: A | Discharge status: Home / Self Care | Payer: 59 | Attending: Obstetrics and Gynecology | Admitting: Obstetrics and Gynecology

## 2021-03-17 ENCOUNTER — Other Ambulatory Visit: Payer: Self-pay | Admitting: Advanced Practice Midwife

## 2021-03-17 ENCOUNTER — Inpatient Hospital Stay (HOSPITAL_COMMUNITY): Payer: 59

## 2021-03-17 ENCOUNTER — Other Ambulatory Visit: Payer: Self-pay

## 2021-03-17 DIAGNOSIS — O26893 Other specified pregnancy related conditions, third trimester: Principal | ICD-10-CM | POA: Diagnosis present

## 2021-03-17 DIAGNOSIS — Z3A39 39 weeks gestation of pregnancy: Secondary | ICD-10-CM | POA: Diagnosis not present

## 2021-03-17 DIAGNOSIS — O99824 Streptococcus B carrier state complicating childbirth: Secondary | ICD-10-CM | POA: Diagnosis present

## 2021-03-17 DIAGNOSIS — Z3490 Encounter for supervision of normal pregnancy, unspecified, unspecified trimester: Secondary | ICD-10-CM

## 2021-03-17 DIAGNOSIS — Z20822 Contact with and (suspected) exposure to covid-19: Secondary | ICD-10-CM | POA: Diagnosis present

## 2021-03-17 DIAGNOSIS — O09529 Supervision of elderly multigravida, unspecified trimester: Secondary | ICD-10-CM

## 2021-03-17 LAB — RESP PANEL BY RT-PCR (FLU A&B, COVID) ARPGX2
Influenza A by PCR: NEGATIVE
Influenza B by PCR: NEGATIVE
SARS Coronavirus 2 by RT PCR: NEGATIVE

## 2021-03-17 LAB — TYPE AND SCREEN
ABO/RH(D): A POS
Antibody Screen: NEGATIVE

## 2021-03-17 LAB — CBC
HCT: 38.6 % (ref 36.0–46.0)
Hemoglobin: 12.6 g/dL (ref 12.0–15.0)
MCH: 32.1 pg (ref 26.0–34.0)
MCHC: 32.6 g/dL (ref 30.0–36.0)
MCV: 98.2 fL (ref 80.0–100.0)
Platelets: 156 10*3/uL (ref 150–400)
RBC: 3.93 MIL/uL (ref 3.87–5.11)
RDW: 14.2 % (ref 11.5–15.5)
WBC: 6.8 10*3/uL (ref 4.0–10.5)
nRBC: 0.3 % — ABNORMAL HIGH (ref 0.0–0.2)

## 2021-03-17 LAB — RPR: RPR Ser Ql: NONREACTIVE

## 2021-03-17 MED ORDER — LACTATED RINGERS IV SOLN: INTRAVENOUS | Status: DC

## 2021-03-17 MED ORDER — OXYCODONE-ACETAMINOPHEN 5-325 MG PO TABS: 2.0000 | ORAL_TABLET | ORAL | Status: DC | PRN

## 2021-03-17 MED ORDER — PENICILLIN G POT IN DEXTROSE 60000 UNIT/ML IV SOLN
3.0000 10*6.[IU] | INTRAVENOUS | Status: DC
  Administered 2021-03-17 (×4): 3 10*6.[IU] via INTRAVENOUS
  Filled 2021-03-17 (×8): qty 50

## 2021-03-17 MED ORDER — ACETAMINOPHEN 325 MG PO TABS: 650.0000 mg | ORAL_TABLET | ORAL | Status: DC | PRN

## 2021-03-17 MED ORDER — SODIUM CHLORIDE 0.9 % IV SOLN
5.0000 10*6.[IU] | Freq: Once | INTRAVENOUS | Status: AC
  Administered 2021-03-17: 08:00:00 5 10*6.[IU] via INTRAVENOUS
  Filled 2021-03-17: qty 5

## 2021-03-17 MED ORDER — LIDOCAINE HCL (PF) 1 % IJ SOLN
30.0000 mL | INTRAMUSCULAR | Status: DC | PRN
  Filled 2021-03-17: qty 30

## 2021-03-17 MED ORDER — OXYCODONE-ACETAMINOPHEN 5-325 MG PO TABS: 1.0000 | ORAL_TABLET | ORAL | Status: DC | PRN

## 2021-03-17 MED ORDER — OXYTOCIN BOLUS FROM INFUSION
333.0000 mL | Freq: Once | INTRAVENOUS | Status: AC
  Administered 2021-03-18: 01:00:00 333 mL via INTRAVENOUS

## 2021-03-17 MED ORDER — LACTATED RINGERS IV SOLN
500.0000 mL | INTRAVENOUS | Status: DC | PRN
  Administered 2021-03-17: 22:00:00 500 mL via INTRAVENOUS

## 2021-03-17 MED ORDER — OXYTOCIN-SODIUM CHLORIDE 30-0.9 UT/500ML-% IV SOLN
2.5000 [IU]/h | INTRAVENOUS | Status: DC
  Administered 2021-03-18: 01:00:00 2.5 [IU]/h via INTRAVENOUS

## 2021-03-17 MED ORDER — ONDANSETRON HCL 4 MG/2ML IJ SOLN: 4.0000 mg | Freq: Four times a day (QID) | INTRAMUSCULAR | Status: DC | PRN

## 2021-03-17 MED ORDER — OXYTOCIN-SODIUM CHLORIDE 30-0.9 UT/500ML-% IV SOLN
1.0000 m[IU]/min | INTRAVENOUS | Status: DC
  Filled 2021-03-17: qty 500

## 2021-03-17 MED ORDER — SOD CITRATE-CITRIC ACID 500-334 MG/5ML PO SOLN: 30.0000 mL | ORAL | Status: DC | PRN

## 2021-03-17 MED ORDER — MISOPROSTOL 50MCG HALF TABLET
50.0000 ug | ORAL_TABLET | ORAL | Status: DC | PRN
  Administered 2021-03-17 (×2): 50 ug via BUCCAL
  Filled 2021-03-17 (×3): qty 1

## 2021-03-17 MED ORDER — FENTANYL CITRATE (PF) 100 MCG/2ML IJ SOLN
100.0000 ug | INTRAMUSCULAR | Status: DC | PRN
  Administered 2021-03-17 (×5): 100 ug via INTRAVENOUS
  Filled 2021-03-17 (×5): qty 2

## 2021-03-17 MED ORDER — TERBUTALINE SULFATE 1 MG/ML IJ SOLN
0.2500 mg | Freq: Once | INTRAMUSCULAR | Status: DC | PRN
  Filled 2021-03-17: qty 1

## 2021-03-17 NOTE — H&P (Signed)
Jessica Fernandez is a 40 y.o. female presenting for IOL at term 2/2 AMA.  OB History     Gravida  5   Para  4   Term  4   Preterm      AB      Living  4      SAB      IAB      Ectopic      Multiple      Live Births  4          Past Medical History:  Diagnosis Date   Tuberculosis    Past Surgical History:  Procedure Laterality Date   NO PAST SURGERIES     Family History: family history is not on file. Social History:  reports that she has never smoked. She has never used smokeless tobacco. She reports that she does not drink alcohol and does not use drugs.     Maternal Diabetes: No Genetic Screening: Normal Maternal Ultrasounds/Referrals: Normal Fetal Ultrasounds or other Referrals:  None Maternal Substance Abuse:  No Significant Maternal Medications:  None Significant Maternal Lab Results:  Group B Strep positive Other Comments:  None  Review of Systems  All other systems reviewed and are negative. Maternal Medical History:  Reason for admission: IOL   Contractions: Frequency: rare.   Fetal activity: Perceived fetal activity is normal.   Last perceived fetal movement was within the past hour.   Prenatal complications: AMA Prenatal Complications - Diabetes: none.    Last menstrual period 06/17/2020.   Fetal Exam Fetal Monitor Review: Mode: ultrasound.   Baseline rate: 145.  Variability: moderate (6-25 bpm).   Pattern: accelerations present and no decelerations.   Fetal State Assessment: Category I - tracings are normal.  Physical Exam Vitals and nursing note reviewed.  Constitutional:      General: She is not in acute distress. HENT:     Head: Normocephalic.  Eyes:     Pupils: Pupils are equal, round, and reactive to light.  Cardiovascular:     Rate and Rhythm: Normal rate.  Pulmonary:     Effort: Pulmonary effort is normal.  Abdominal:     Palpations: Abdomen is soft.     Tenderness: There is no abdominal tenderness.   Genitourinary:    Comments: Dilation: 1 Effacement (%): Thick Station: -3 Presentation: Vertex Exam by:: Mathews Robinsons CNM  Skin:    General: Skin is warm and dry.  Neurological:     Mental Status: She is alert and oriented to person, place, and time.  Psychiatric:        Mood and Affect: Mood normal.        Behavior: Behavior normal.    Prenatal labs: ABO, Rh:  A positive  Antibody:  Negative Rubella:  Immune  RPR:   NR HBsAg:   Negative  HIV:   Negative  GBS:  Positive   Assessment/Plan: 40 y.o. [id number] at [redacted]w[redacted]d  Admit to Labor and delivery for IOL 2/2 AMA Cervical Ripening, Pitocin PRN  Anticipate NSVD  Planning an epidural  Breast/Bottle feeding Desires Circ No birth control      Thressa Sheller DNP, CNM  03/17/21  7:08 AM

## 2021-03-17 NOTE — Progress Notes (Addendum)
L&D Note  03/17/2021 - 9:57 PM  40 y.o. [id number] [redacted]w[redacted]d. Pregnancy complicated by [facility]  Patient Active Problem List   Diagnosis Date Noted   Term pregnancy 03/17/2021    Ms. Nile Riggs is admitted for IOL for AMA   Subjective:  Patient starting to feel stronger contractions Objective:   Vitals:   03/17/21 1640 03/17/21 1700 03/17/21 1732 03/17/21 1932  BP: 118/72  125/68 119/69  Pulse: 86  89 83  Resp:  18 17 16   Temp:    98.3 F (36.8 C)  TempSrc:    Oral  Weight:      Height:        Current Vital Signs 24h Vital Sign Ranges  T 98.3 F (36.8 C) Temp  Avg: 98.4 F (36.9 C)  Min: 98.2 F (36.8 C)  Max: 98.5 F (36.9 C)  BP 119/69 BP  Min: 112/65  Max: 129/82  HR 83 Pulse  Avg: 88.3  Min: 78  Max: 123  RR 16 Resp  Avg: 17.4  Min: 16  Max: 18  SaO2     No data recorded       24 Hour I/O Current Shift I/O  Time Ins Outs No intake/output data recorded. No intake/output data recorded.   FHR: 140 baseline, occasiional accels, no decel, mod variability Toco: q33m Gen: NAD SVE: 6-7/80/-1>forebag rupture with minimal fluid  Labs:  Recent Labs  Lab 03/17/21 0733  WBC 6.8  HGB 12.6  HCT 38.6  PLT 156    Medications Current Facility-Administered Medications  Medication Dose Route Frequency Provider Last Rate Last Admin   acetaminophen (TYLENOL) tablet 650 mg  650 mg Oral Q4H PRN 03/19/21, CNM       fentaNYL (SUBLIMAZE) injection 100 mcg  100 mcg Intravenous Q1H PRN Armando Reichert D, CNM   100 mcg at 03/17/21 2113   lactated ringers infusion 500-1,000 mL  500-1,000 mL Intravenous PRN 2114, CNM 999 mL/hr at 03/17/21 2140 500 mL at 03/17/21 2140   lactated ringers infusion   Intravenous Continuous 2141, CNM 125 mL/hr at 03/17/21 0731 New Bag at 03/17/21 0731   lidocaine (PF) (XYLOCAINE) 1 % injection 30 mL  30 mL Subcutaneous PRN 03/19/21, CNM       misoprostol (CYTOTEC) tablet 50 mcg  50 mcg Buccal Q4H PRN Armando Reichert D, CNM    50 mcg at 03/17/21 1253   ondansetron (ZOFRAN) injection 4 mg  4 mg Intravenous Q6H PRN 03/19/21, CNM       oxyCODONE-acetaminophen (PERCOCET/ROXICET) 5-325 MG per tablet 1 tablet  1 tablet Oral Q4H PRN Armando Reichert, CNM       oxyCODONE-acetaminophen (PERCOCET/ROXICET) 5-325 MG per tablet 2 tablet  2 tablet Oral Q4H PRN Armando Reichert, CNM       oxytocin (PITOCIN) IV BOLUS FROM BAG  333 mL Intravenous Once Armando Reichert D, CNM       oxytocin (PITOCIN) IV infusion 30 units in NS 500 mL - Premix  2.5 Units/hr Intravenous Continuous Thressa Sheller D, CNM       oxytocin (PITOCIN) IV infusion 30 units in NS 500 mL - Premix  1-40 milli-units/min Intravenous Titrated Thressa Sheller D, CNM       penicillin G potassium 3 Million Units in dextrose 79mL IVPB  3 Million Units Intravenous Q4H 45m D, CNM 100 mL/hr at 03/17/21 2000 3 Million Units at 03/17/21 2000   sodium citrate-citric acid (ORACIT)  solution 30 mL  30 mL Oral Q2H PRN Thressa Sheller D, CNM       terbutaline (BRETHINE) injection 0.25 mg  0.25 mg Subcutaneous Once PRN Armando Reichert, CNM        Assessment & Plan:  Pt doing well *IUP: category I tracing *IOL: pt reluctant for augmentation but finally amenable to arom. Recheck in a few hours. If unchanged, ask pt re: pitocin *GBS: s/p PCN x 2 *Analgesia: not desiring epidural  Cornelia Copa MD Attending Center for [facility])

## 2021-03-17 NOTE — Progress Notes (Signed)
L&D Note  03/17/2021 - 11:53 PM  40 y.o. [id number] [redacted]w[redacted]d. Pregnancy complicated by [facility]  Patient Active Problem List   Diagnosis Date Noted   Term pregnancy 03/17/2021   AMA (advanced maternal age) multigravida 35+ 03/17/2021    Ms. Jessica Fernandez is admitted for IOL for AMA   Subjective:  Patient feeling stronger contractions Objective:    Current Vital Signs 24h Vital Sign Ranges  T 98.3 F (36.8 C) Temp  Avg: 98.4 F (36.9 C)  Min: 98.2 F (36.8 C)  Max: 98.5 F (36.9 C)  BP 139/72 BP  Min: 112/65  Max: 139/72  HR 94 Pulse  Avg: 88.9  Min: 78  Max: 123  RR 16 Resp  Avg: 17.4  Min: 16  Max: 18  SaO2     No data recorded       24 Hour I/O Current Shift I/O  Time Ins Outs No intake/output data recorded. No intake/output data recorded.   FHR: 140 baseline, occasiional accels, no decel, mod variability Toco: q59m Gen: NAD SVE: 8/80/0 for presenting part  Labs:  Recent Labs  Lab 03/17/21 0733  WBC 6.8  HGB 12.6  HCT 38.6  PLT 156     Medications Current Facility-Administered Medications  Medication Dose Route Frequency Provider Last Rate Last Admin   acetaminophen (TYLENOL) tablet 650 mg  650 mg Oral Q4H PRN Armando Reichert, CNM       fentaNYL (SUBLIMAZE) injection 100 mcg  100 mcg Intravenous Q1H PRN Thressa Sheller D, CNM   100 mcg at 03/17/21 2225   lactated ringers infusion 500-1,000 mL  500-1,000 mL Intravenous PRN Armando Reichert, CNM 999 mL/hr at 03/17/21 2140 500 mL at 03/17/21 2140   lactated ringers infusion   Intravenous Continuous Armando Reichert, CNM 125 mL/hr at 03/17/21 0731 New Bag at 03/17/21 0731   lidocaine (PF) (XYLOCAINE) 1 % injection 30 mL  30 mL Subcutaneous PRN Armando Reichert, CNM       misoprostol (CYTOTEC) tablet 50 mcg  50 mcg Buccal Q4H PRN Thressa Sheller D, CNM   50 mcg at 03/17/21 1253   ondansetron (ZOFRAN) injection 4 mg  4 mg Intravenous Q6H PRN Armando Reichert, CNM       oxyCODONE-acetaminophen (PERCOCET/ROXICET) 5-325 MG per  tablet 1 tablet  1 tablet Oral Q4H PRN Armando Reichert, CNM       oxyCODONE-acetaminophen (PERCOCET/ROXICET) 5-325 MG per tablet 2 tablet  2 tablet Oral Q4H PRN Armando Reichert, CNM       oxytocin (PITOCIN) IV BOLUS FROM BAG  333 mL Intravenous Once Thressa Sheller D, CNM       oxytocin (PITOCIN) IV infusion 30 units in NS 500 mL - Premix  2.5 Units/hr Intravenous Continuous Thressa Sheller D, CNM       oxytocin (PITOCIN) IV infusion 30 units in NS 500 mL - Premix  1-40 milli-units/min Intravenous Titrated Thressa Sheller D, CNM       penicillin G potassium 3 Million Units in dextrose 6mL IVPB  3 Million Units Intravenous Q4H Armando Reichert, CNM 100 mL/hr at 03/17/21 2000 3 Million Units at 03/17/21 2000   sodium citrate-citric acid (ORACIT) solution 30 mL  30 mL Oral Q2H PRN Armando Reichert, CNM       terbutaline (BRETHINE) injection 0.25 mg  0.25 mg Subcutaneous Once PRN Armando Reichert, CNM        Assessment & Plan:  Pt doing well *IUP: category I  tracing *IOL: pt progressing on her own Recheck in a few hours. If unchanged, ask pt re: pitocin *GBS: s/p PCN x 2 *Analgesia: not desiring epidural. I told her to hold off on any more IV medications  Cornelia Copa MD Attending Center for [facility])

## 2021-03-18 ENCOUNTER — Encounter (HOSPITAL_COMMUNITY): Payer: Self-pay | Admitting: Family Medicine

## 2021-03-18 DIAGNOSIS — O99824 Streptococcus B carrier state complicating childbirth: Secondary | ICD-10-CM

## 2021-03-18 DIAGNOSIS — Z3A39 39 weeks gestation of pregnancy: Secondary | ICD-10-CM

## 2021-03-18 MED ORDER — DIPHENHYDRAMINE HCL 25 MG PO CAPS: 25.0000 mg | ORAL_CAPSULE | Freq: Four times a day (QID) | ORAL | Status: DC | PRN

## 2021-03-18 MED ORDER — COCONUT OIL OIL: 1.0000 "application " | TOPICAL_OIL | Status: DC | PRN

## 2021-03-18 MED ORDER — SIMETHICONE 80 MG PO CHEW: 80.0000 mg | CHEWABLE_TABLET | ORAL | Status: DC | PRN

## 2021-03-18 MED ORDER — OXYTOCIN-SODIUM CHLORIDE 30-0.9 UT/500ML-% IV SOLN: 2.5000 [IU]/h | INTRAVENOUS | Status: DC | PRN

## 2021-03-18 MED ORDER — PRENATAL MULTIVITAMIN CH
1.0000 | ORAL_TABLET | Freq: Every day | ORAL | Status: DC
  Administered 2021-03-18 – 2021-03-19 (×2): 1 via ORAL
  Filled 2021-03-18 (×2): qty 1

## 2021-03-18 MED ORDER — TETANUS-DIPHTH-ACELL PERTUSSIS 5-2.5-18.5 LF-MCG/0.5 IM SUSY: 0.5000 mL | PREFILLED_SYRINGE | Freq: Once | INTRAMUSCULAR | Status: DC

## 2021-03-18 MED ORDER — BENZOCAINE-MENTHOL 20-0.5 % EX AERO: 1.0000 "application " | INHALATION_SPRAY | CUTANEOUS | Status: DC | PRN

## 2021-03-18 MED ORDER — OXYCODONE HCL 5 MG PO TABS: 5.0000 mg | ORAL_TABLET | ORAL | Status: DC | PRN

## 2021-03-18 MED ORDER — ONDANSETRON HCL 4 MG/2ML IJ SOLN: 4.0000 mg | INTRAMUSCULAR | Status: DC | PRN

## 2021-03-18 MED ORDER — WITCH HAZEL-GLYCERIN EX PADS: 1.0000 "application " | MEDICATED_PAD | CUTANEOUS | Status: DC | PRN

## 2021-03-18 MED ORDER — IBUPROFEN 600 MG PO TABS
600.0000 mg | ORAL_TABLET | Freq: Four times a day (QID) | ORAL | Status: DC
  Administered 2021-03-18 – 2021-03-19 (×6): 600 mg via ORAL
  Filled 2021-03-18 (×6): qty 1

## 2021-03-18 MED ORDER — SENNOSIDES-DOCUSATE SODIUM 8.6-50 MG PO TABS
2.0000 | ORAL_TABLET | Freq: Every evening | ORAL | Status: DC | PRN
  Administered 2021-03-19: 12:00:00 2 via ORAL

## 2021-03-18 MED ORDER — ONDANSETRON HCL 4 MG PO TABS: 4.0000 mg | ORAL_TABLET | ORAL | Status: DC | PRN

## 2021-03-18 MED ORDER — ACETAMINOPHEN 325 MG PO TABS: 650.0000 mg | ORAL_TABLET | ORAL | Status: DC | PRN

## 2021-03-18 MED ORDER — DIBUCAINE (PERIANAL) 1 % EX OINT: 1.0000 "application " | TOPICAL_OINTMENT | CUTANEOUS | Status: DC | PRN

## 2021-03-18 NOTE — Discharge Summary (Signed)
° °  Postpartum Discharge Summary ° ° ° °   °Patient Name: Jessica Fernandez °DOB: 05/22/1980 °MRN: [id number] ° °Date of admission: 03/17/2021 °Delivery date:03/18/2021  °Delivering provider: PICKENS, CHARLIE  °Date of discharge: 03/19/2021 ° °Admitting diagnosis: Pregnancy at 39/0. IOL for AMA °   °Discharge diagnosis: Term Pregnancy Delivered                                              °Post partum procedures:none °Augmentation: cytotec, AROM °Complications: None ° °Hospital course: Patient had an uncomplicated labor course as follows:  °Membrane Rupture Time/Date: 4:36 PM ,03/17/2021   °Delivery Method:Vaginal, Spontaneous  °Episiotomy: None  °Lacerations:  None None °Patient had an uncomplicated postpartum course.  She is ambulating, tolerating a regular diet, passing flatus, and urinating well. Patient is discharged home in stable condition on 03/19/21. ° °Newborn Data: °Birth date:03/18/2021  °Birth time:1:06 AM  °Gender:Female  °Living status:Living  °Apgars:9 ,9  °Weight:3300 g  ° °Magnesium Sulfate received: No °BMZ received: No °Rhophylac:N/A °MMR:N/A °T-DaP:Given prenatally °Flu: Yes °Transfusion:No ° °Physical exam  °Vitals:  ° 03/18/21 1144 03/18/21 1513 03/18/21 2030 03/19/21 0443  °BP: 99/61 (!) 110/56 112/70 133/84  °Pulse: 80 80 82 82  °Resp: 18 18 18 18  °Temp: 98.2 °F (36.8 °C) 98.4 °F (36.9 °C) 98.1 °F (36.7 °C) 98.4 °F (36.9 °C)  °TempSrc: Oral Oral Oral Oral  °SpO2:  99% 100% 100%  °Weight:      °Height:      ° °General: alert, cooperative, and no distress °Lochia: appropriate °Uterine Fundus: firm °Incision: N/A °DVT Evaluation: No evidence of DVT seen on physical exam. °Negative Homan's sign. °No cords or calf tenderness. °No significant calf/ankle edema. °Labs: °Lab Results  °Component Value Date  ° WBC 6.8 03/17/2021  ° HGB 12.6 03/17/2021  ° HCT 38.6 03/17/2021  ° MCV 98.2 03/17/2021  ° PLT 156 03/17/2021  ° °CMP Latest Ref Rng & Units 05/27/2016  °Glucose 65 - 99 mg/dL 89  °BUN 6 - 20 mg/dL 5(L)   °Creatinine 0.44 - 1.00 mg/dL 0.73  °Sodium 135 - 145 mmol/L 138  °Potassium 3.5 - 5.1 mmol/L 4.0  °Chloride 101 - 111 mmol/L 106  °CO2 22 - 32 mmol/L 27  °Calcium 8.9 - 10.3 mg/dL 8.8(L)  °Total Protein 6.5 - 8.1 g/dL 7.4  °Total Bilirubin 0.3 - 1.2 mg/dL 0.6  °Alkaline Phos 38 - 126 U/L 41  °AST 15 - 41 U/L 17  °ALT 14 - 54 U/L 15  ° °Edinburgh Score: °Edinburgh Postnatal Depression Scale Screening Tool 03/18/2021  °I have been able to laugh and see the funny side of things. 0  °I have looked forward with enjoyment to things. 0  °I have blamed myself unnecessarily when things went wrong. 2  °I have been anxious or worried for no good reason. 2  °I have felt scared or panicky for no good reason. 0  °Things have been getting on top of me. 0  °I have been so unhappy that I have had difficulty sleeping. 0  °I have felt sad or miserable. 0  °I have been so unhappy that I have been crying. 0  °The thought of harming myself has occurred to me. 0  °Edinburgh Postnatal Depression Scale Total 4  ° ° ° °After visit meds:  °Allergies as of 03/19/2021   °No Known Allergies °  ° °  °  Allergies      Medication List     STOP taking these medications    hydrOXYzine 25 MG tablet Commonly known as: ATARAX       TAKE these medications    acetaminophen 325 MG tablet Commonly known as: Tylenol Take 2 tablets (650 mg total) by mouth every 4 (four) hours as needed (for pain scale < 4).   ibuprofen 600 MG tablet Commonly known as: ADVIL Take 1 tablet (600 mg total) by mouth every 6 (six) hours.   prenatal multivitamin Tabs tablet Take 1 tablet by mouth daily at 12 noon.         Discharge home in stable condition Infant Feeding: Bottle and Breast Infant Disposition:home with mother Discharge instruction: per After Visit Summary and Postpartum booklet. Activity: Advance as tolerated. Pelvic rest for 6 weeks.  Diet: routine diet Future Appointments:No future appointments. Follow up Visit:  Follow-up Information      Department, [facility] Schedule an appointment as soon as possible for a visit in 6 week(s).   Why: for postpartum checkup Contact information: [address] [place] [address] [phone]                   03/19/2021 Christin Fudge, CNM

## 2021-03-18 NOTE — Lactation Note (Signed)
This note was copied from a baby's chart. Lactation Consultation Note  Patient Name: Jessica Fernandez [id number] Date: 03/18/2021 Reason for consult: Initial assessment Age:39 hours  P5, Baby cueing.  Mother states she is concerned about her milk supply so she is also giving formula to baby. Reviewed hand expression with drops for reassurance. Provided education on supply and demand. Baby latched with ease.  Provided pillow under baby for support. Feed on demand with cues.  Goal 8-12+ times per day after first 24 hrs.  Place baby STS if not cueing.  Mom made aware of O/P services, breastfeeding support groups, and our phone # for post-discharge questions.    Maternal Data Has patient been taught Hand Expression?: Yes Does the patient have breastfeeding experience prior to this delivery?: Yes How long did the patient breastfeed?: 6 mos.  Feeding Mother's Current Feeding Choice: Breast Milk and Formula Nipple Type: Slow - flow  LATCH Score Latch: Grasps breast easily, tongue down, lips flanged, rhythmical sucking.  Audible Swallowing: A few with stimulation  Type of Nipple: Everted at rest and after stimulation  Comfort (Breast/Nipple): Soft / non-tender  Hold (Positioning): Assistance needed to correctly position infant at breast and maintain latch.  LATCH Score: 8   Lactation Tools Discussed/Used    Interventions Interventions: Assisted with latch;Skin to skin;Hand express;Education  Discharge    Consult Status Consult Status: Follow-up Date: 03/19/21 Follow-up type: In-patient    Dahlia Byes [facility] 03/18/2021, 8:22 AM

## 2021-03-18 NOTE — Lactation Note (Signed)
This note was copied from a baby's chart. Lactation Consultation Note  Patient Name: Jessica Fernandez [facility] Date: 03/18/2021 Reason for consult: L&D Initial assessment Age:39 hours Mom latched infant on her right breast using the cradle hold position. LC assisted mom with  positioning infant for infant to have a deeper latch, infant was still breastfeeding after 20 minutes when LC left the room. Mom knows to breastfeed infant according to primal feeding cues, 8 to 12+ or more times within 24 hours, skin to skin. Mom knows to call RN/LC on MBU if she needs further assistance with latching infant at the breast.  Maternal Data    Feeding Mother's Current Feeding Choice: Breast Milk and Formula  LATCH Score Latch: Grasps breast easily, tongue down, lips flanged, rhythmical sucking.  Audible Swallowing: Spontaneous and intermittent  Type of Nipple: Everted at rest and after stimulation  Comfort (Breast/Nipple): Soft / non-tender  Hold (Positioning): Assistance needed to correctly position infant at breast and maintain latch.  LATCH Score: 9   Lactation Tools Discussed/Used    Interventions Interventions: Skin to skin;Breast compression;Assisted with latch;Education  Discharge    Consult Status Consult Status: Follow-up from L&D    Danelle Earthly 03/18/2021, 2:14 AM

## 2021-03-19 MED ORDER — ACETAMINOPHEN 325 MG PO TABS: 650.0000 mg | ORAL_TABLET | ORAL | Status: AC | PRN

## 2021-03-19 MED ORDER — IBUPROFEN 600 MG PO TABS: 600.0000 mg | ORAL_TABLET | Freq: Four times a day (QID) | ORAL | 0 refills | Status: AC

## 2021-03-19 NOTE — Lactation Note (Signed)
This note was copied from a baby's chart. Lactation Consultation Note  Patient Name: Boy Tyeesha Riker [id number] Date: 03/19/2021 Reason for consult: Follow-up assessment Age:39 hours  P5, Primarily formula feeding. Provided mother with manual pump fitted with 27 flanges which seem appropriate.  Also provided 30 mm flanges. Encouraged mother to continue to offer breast before formula to help establish milk supply. Reviewed engorgement care and monitoring voids/stools.   Feeding Mother's Current Feeding Choice: Breast Milk and Formula   Lactation Tools Discussed/Used Tools: Pump;Flanges Flange Size: 27;30 Breast pump type: Manual  Interventions Interventions: Breast feeding basics reviewed;Hand pump;Education  Discharge Discharge Education: Engorgement and breast care;Warning signs for feeding baby  Consult Status Consult Status: Complete Date: 03/19/21    Dahlia Byes [facility] 03/19/2021, 2:02 PM

## 2021-03-19 NOTE — Progress Notes (Signed)
Circumcision Consent  Discussed with mom at bedside about circumcision.   Circumcision is a surgery that removes the skin that covers the tip of the penis, called the "foreskin." Circumcision is usually done when a boy is between 1 and 10 days old, sometimes up to 3-4 weeks old.  The most common reasons boys are circumcised include for cultural/religious beliefs or for parental preference (potentially easier to clean, so baby looks like daddy, etc).  There may be some medical benefits for circumcision:   Circumcised boys seem to have slightly lower rates of: ? Urinary tract infections (per the American Academy of Pediatrics an uncircumcised boy has a 1/100 chance of developing a UTI in the first year of life, a circumcised boy at a 03/998 chance of developing a UTI in the first year of life- a 10% reduction) ? Penis cancer (typically rare- an uncircumcised female has a 1 in 100,000 chance of developing cancer of the penis) ? Sexually transmitted infection (in endemic areas, including HIV, HPV and Herpes- circumcision does NOT protect against gonorrhea, chlamydia, trachomatis, or syphilis) ? Phimosis: a condition where that makes retraction of the foreskin over the glans impossible (0.4 per 1000 boys per year or 0.6% of boys are affected by their 15th birthday)  Boys and men who are not circumcised can reduce these extra risks by: ? Cleaning their penis well ? Using condoms during sex  What are the risks of circumcision?  As with any surgical procedure, there are risks and complications. In circumcision, complications are rare and usually minor, the most common being: ? Bleeding- risk is reduced by holding each clamp for 30 seconds prior to a cut being made, and by holding pressure after the procedure is done ? Infection- the penis is cleaned prior to the procedure, and the procedure is done under sterile technique ? Damage to the urethra or amputation of the penis  How is circumcision done  in baby boys?  The baby will be placed on a special table and the legs restrained for their safety. Numbing medication is injected into the penis, and the skin is cleansed with betadine to decrease the risk of infection.   What to expect:  The penis will look red and raw for 5-7 days as it heals. We expect scabbing around where the cut was made, as well as clear-pink fluid and some swelling of the penis right after the procedure. If your baby's circumcision starts to bleed or develops pus, please contact your pediatrician immediately.  All questions were answered and mother consented.  Jessica Fernandez Obstetrics Fellow  

## 2021-03-30 ENCOUNTER — Telehealth (HOSPITAL_COMMUNITY): Payer: Self-pay

## 2021-03-30 NOTE — Telephone Encounter (Signed)
"  I'm fine." Patient has no questions or concerns about her healing.  "He is good. I just went to the pediatrician and he had a good check up. He sleeps in a crib." RN reviewed ABC's of safe sleep with patient. Patient declines any questions or concerns about baby.  EPDS score is 5.  Marcelino Duster [facility] 03/30/2021,1408

## 2021-05-15 ENCOUNTER — Emergency Department (HOSPITAL_COMMUNITY)
Admission: EM | Admit: 2021-05-15 | Discharge: 2021-05-16 | Disposition: A | Discharge status: Home / Self Care | Payer: Medicaid Other | Attending: Emergency Medicine | Admitting: Emergency Medicine

## 2021-05-15 DIAGNOSIS — N898 Other specified noninflammatory disorders of vagina: Secondary | ICD-10-CM | POA: Diagnosis not present

## 2021-05-15 DIAGNOSIS — N9489 Other specified conditions associated with female genital organs and menstrual cycle: Secondary | ICD-10-CM | POA: Diagnosis not present

## 2021-05-15 DIAGNOSIS — R102 Pelvic and perineal pain: Secondary | ICD-10-CM | POA: Insufficient documentation

## 2021-05-15 LAB — URINALYSIS, ROUTINE W REFLEX MICROSCOPIC
Bacteria, UA: NONE SEEN
Bilirubin Urine: NEGATIVE
Glucose, UA: NEGATIVE mg/dL
Ketones, ur: NEGATIVE mg/dL
Leukocytes,Ua: NEGATIVE
Nitrite: NEGATIVE
Protein, ur: NEGATIVE mg/dL
Specific Gravity, Urine: 1.008 (ref 1.005–1.030)
pH: 6 (ref 5.0–8.0)

## 2021-05-15 LAB — COMPREHENSIVE METABOLIC PANEL
ALT: 16 U/L (ref 0–44)
AST: 21 U/L (ref 15–41)
Albumin: 3.7 g/dL (ref 3.5–5.0)
Alkaline Phosphatase: 69 U/L (ref 38–126)
Anion gap: 8 (ref 5–15)
BUN: 5 mg/dL — ABNORMAL LOW (ref 6–20)
CO2: 25 mmol/L (ref 22–32)
Calcium: 9.2 mg/dL (ref 8.9–10.3)
Chloride: 105 mmol/L (ref 98–111)
Creatinine, Ser: 0.85 mg/dL (ref 0.44–1.00)
GFR, Estimated: >60 mL/min (ref >60)
Glucose, Bld: 95 mg/dL (ref 70–99)
Potassium: 2.9 mmol/L — ABNORMAL LOW (ref 3.5–5.1)
Sodium: 138 mmol/L (ref 135–145)
Total Bilirubin: 0.6 mg/dL (ref 0.3–1.2)
Total Protein: 7.4 g/dL (ref 6.5–8.1)

## 2021-05-15 LAB — CBC
HCT: 42.4 % (ref 36.0–46.0)
Hemoglobin: 14 g/dL (ref 12.0–15.0)
MCH: 31.3 pg (ref 26.0–34.0)
MCHC: 33 g/dL (ref 30.0–36.0)
MCV: 94.6 fL (ref 80.0–100.0)
Platelets: 212 10*3/uL (ref 150–400)
RBC: 4.48 MIL/uL (ref 3.87–5.11)
RDW: 12.6 % (ref 11.5–15.5)
WBC: 4.6 10*3/uL (ref 4.0–10.5)
nRBC: 0 % (ref 0.0–0.2)

## 2021-05-15 LAB — LIPASE, BLOOD: Lipase: 44 U/L (ref 11–51)

## 2021-05-15 LAB — I-STAT BETA HCG BLOOD, ED (MC, WL, AP ONLY): I-stat hCG, quantitative: <5 m[IU]/mL (ref <5)

## 2021-05-15 MED ORDER — POTASSIUM CHLORIDE CRYS ER 20 MEQ PO TBCR
40.0000 meq | EXTENDED_RELEASE_TABLET | Freq: Once | ORAL | Status: AC
  Administered 2021-05-15: 40 meq via ORAL
  Filled 2021-05-15: qty 2

## 2021-05-15 MED ORDER — IBUPROFEN 800 MG PO TABS
800.0000 mg | ORAL_TABLET | Freq: Once | ORAL | Status: AC
  Administered 2021-05-15: 800 mg via ORAL
  Filled 2021-05-15: qty 1

## 2021-05-15 NOTE — ED Provider Notes (Signed)
[facility] EMERGENCY DEPARTMENT Provider Note   CSN: [id number] Arrival date & time: 05/15/21  1934     History  No chief complaint on file.   Jessica Fernandez is a 41 y.o. female.  The history is provided by the patient and medical records.  Jessica Fernandez is a 41 y.o. female who presents to the Emergency Department complaining of pelvic pain.  She presents to the ED complaining of pelvic pain that started at 6pm.  Located throughout the lower pelvis, worse with movement, nonradiating.   No vaginal discharge no dysuria.  She did feel hot but there is no recorded fever.  No nausea, vomiting.  She is status post vaginal delivery on December 29.  She is bottlefeeding.  No prior similar symptoms. She is not sexually active since her delivery.    Home Medications Prior to Admission medications   Medication Sig Start Date End Date Taking? Authorizing Provider  acetaminophen (TYLENOL) 325 MG tablet Take 2 tablets (650 mg total) by mouth every 4 (four) hours as needed (for pain scale < 4). 03/19/21   Cresenzo-Dishmon, Scarlette Calico, CNM  ibuprofen (ADVIL) 600 MG tablet Take 1 tablet (600 mg total) by mouth every 6 (six) hours. 03/19/21   Cresenzo-Dishmon, Scarlette Calico, CNM  Prenatal Vit-Fe Fumarate-FA (PRENATAL MULTIVITAMIN) TABS tablet Take 1 tablet by mouth daily at 12 noon.    [provider]      Allergies    Patient has no known allergies.    Review of Systems   Review of Systems  All other systems reviewed and are negative.  Physical Exam Updated Vital Signs BP 108/60    Pulse (!) 105    Temp 99.3 F (37.4 C) (Oral)    Resp 18    LMP 06/17/2020    SpO2 97%  Physical Exam Vitals and nursing note reviewed.  Constitutional:      Appearance: She is well-developed.  HENT:     Head: Normocephalic and atraumatic.  Cardiovascular:     Rate and Rhythm: Normal rate and regular rhythm.     Heart sounds: No murmur heard. Pulmonary:     Effort: Pulmonary effort is normal.  No respiratory distress.     Breath sounds: Normal breath sounds.  Abdominal:     Palpations: Abdomen is soft.     Tenderness: There is no guarding or rebound.     Comments: Moderate lower abdominal tenderness  Genitourinary:    Comments: Small amount of mucoid vaginal discharge.   Musculoskeletal:        General: No tenderness.  Skin:    General: Skin is warm and dry.  Neurological:     Mental Status: She is alert and oriented to person, place, and time.  Psychiatric:        Behavior: Behavior normal.    ED Results / Procedures / Treatments   Labs (all labs ordered are listed, but only abnormal results are displayed) Labs Reviewed  WET PREP, GENITAL - Abnormal; Notable for the following components:      Result Value   WBC, Wet Prep HPF POC >=10 (*)    All other components within normal limits  COMPREHENSIVE METABOLIC PANEL - Abnormal; Notable for the following components:   Potassium 2.9 (*)    BUN 5 (*)    All other components within normal limits  URINALYSIS, ROUTINE W REFLEX MICROSCOPIC - Abnormal; Notable for the following components:   Color, Urine STRAW (*)    Hgb urine dipstick MODERATE (*)  All other components within normal limits  LIPASE, BLOOD  CBC  I-STAT BETA HCG BLOOD, ED ([facility], [facility], [facility] ONLY)  GC/CHLAMYDIA PROBE AMP ([place]) NOT AT [facility]    EKG None  Radiology CT Abdomen Pelvis W Contrast  Result Date: 05/16/2021 CLINICAL DATA:  Lower abdominal pain EXAM: CT ABDOMEN AND PELVIS WITH CONTRAST TECHNIQUE: Multidetector CT imaging of the abdomen and pelvis was performed using the standard protocol following bolus administration of intravenous contrast. RADIATION DOSE REDUCTION: This exam was performed according to the departmental dose-optimization program which includes automated exposure control, adjustment of the mA and/or kV according to patient size and/or use of iterative reconstruction technique. CONTRAST:  170mL ISOVUE-300 IOPAMIDOL (ISOVUE-300)  INJECTION 61% COMPARISON:  None. FINDINGS: Lower chest: No acute abnormality. Hepatobiliary: No focal hepatic abnormality. Gallbladder unremarkable. Pancreas: No focal abnormality or ductal dilatation. Spleen: No focal abnormality.  Normal size. Adrenals/Urinary Tract: 10 mm low-density lesion in the midpole of the right kidney most compatible with cyst. Adrenal glands unremarkable. No stones or hydronephrosis. Urinary bladder unremarkable. Stomach/Bowel: Normal appendix. Several mildly prominent fluid-filled proximal and mid small bowel loops. Distal small bowel loops are decompressed. Cannot exclude early partial small bowel obstruction. Appendix is normal. Stomach and large bowel grossly unremarkable. Vascular/Lymphatic: No evidence of aneurysm or adenopathy. Reproductive: Uterus and adnexa unremarkable.  No mass. Other: No free fluid or free air. Musculoskeletal: No acute bony abnormality. IMPRESSION: Mildly prominent fluid-filled proximal to mid small bowel loops. Cannot exclude early low grade partial small bowel obstruction. Small cyst in the midpole of the right kidney. Electronically Signed   By: Rolm Baptise M.D.   On: 05/16/2021 03:11   US PELVIC COMPLETE W TRANSVAGINAL AND TORSION R/O  Result Date: 05/16/2021 CLINICAL DATA:  Pelvic pain. EXAM: TRANSABDOMINAL AND TRANSVAGINAL ULTRASOUND OF PELVIS DOPPLER ULTRASOUND OF OVARIES TECHNIQUE: Both transabdominal and transvaginal ultrasound examinations of the pelvis were performed. Transabdominal technique was performed for global imaging of the pelvis including uterus, ovaries, adnexal regions, and pelvic cul-de-sac. It was necessary to proceed with endovaginal exam following the transabdominal exam to visualize the endometrium and ovaries. Color and duplex Doppler ultrasound was utilized to evaluate blood flow to the ovaries. COMPARISON:  None. FINDINGS: Uterus Measurements: 11.2 x 4.9 x 6.9 cm = volume: 200 mL. No fibroids or other mass visualized.  Endometrium Thickness: 12 mm.  No focal abnormality visualized. Right ovary Measurements: 4.7 x 3.4 x 3.4 cm = volume: 28 mL. Normal appearance/no adnexal mass. Left ovary Measurements: 3.9 x 1.7 x 2.6 cm = volume: 9.2 mL. Normal appearance/no adnexal mass. Pulsed Doppler evaluation of both ovaries demonstrates normal low-resistance arterial and venous waveforms. Other findings No abnormal free fluid. IMPRESSION: Unremarkable pelvic ultrasound. Electronically Signed   By: Anner Crete M.D.   On: 05/16/2021 01:29    Procedures Procedures    Medications Ordered in ED Medications  potassium chloride SA (KLOR-CON M) CR tablet 40 mEq (40 mEq Oral Given 05/15/21 2353)  ibuprofen (ADVIL) tablet 800 mg (800 mg Oral Given 05/15/21 2353)  oxyCODONE-acetaminophen (PERCOCET/ROXICET) 5-325 MG per tablet 1 tablet (1 tablet Oral Given 05/16/21 0052)  iopamidol (ISOVUE-300) 61 % injection 100 mL (100 mLs Intravenous Contrast Given 05/16/21 0306)    ED Course/ Medical Decision Making/ A&P                           Medical Decision Making Amount and/or Complexity of Data Reviewed Labs: ordered. Radiology: ordered. ECG/medicine tests:  ordered.  Risk Prescription drug management.   Patient is 7 weeks postpartum following vaginal delivery here for evaluation of pelvic pain.  UA is not consistent with UTI.  Pelvic examination is not consistent with PID.  Labs significant for mild hypokalemia, this was replaced orally.  Pelvic ultrasound is negative for torsion.  CT abdomen pelvis was obtained, which demonstrates possible early partial bowel obstruction.  Patient is able to eat and drink without difficulty in the department, on reassessment she is feeling improved.  Current clinical picture is not consistent with bowel obstruction.  Discussed with patient unclear source of symptoms.  Feel she is stable for discharge home with close outpatient follow-up and return precautions.  Discussed incidental finding of  renal cyst.        Final Clinical Impression(s) / ED Diagnoses Final diagnoses:  Pelvic pain    Rx / DC Orders ED Discharge Orders     None         Quintella Reichert, MD 05/16/21 [phone]

## 2021-05-15 NOTE — ED Triage Notes (Signed)
Pt c/o lower abd pain x1hr. Denies urinary symptoms, denies N/V

## 2021-05-16 ENCOUNTER — Emergency Department (HOSPITAL_COMMUNITY): Payer: Medicaid Other

## 2021-05-16 LAB — WET PREP, GENITAL
Clue Cells Wet Prep HPF POC: NONE SEEN
Sperm: NONE SEEN
Trich, Wet Prep: NONE SEEN
WBC, Wet Prep HPF POC: >=10 — AB (ref <10)
Yeast Wet Prep HPF POC: NONE SEEN

## 2021-05-16 MED ORDER — OXYCODONE-ACETAMINOPHEN 5-325 MG PO TABS
1.0000 | ORAL_TABLET | Freq: Once | ORAL | Status: AC
  Administered 2021-05-16: 1 via ORAL
  Filled 2021-05-16: qty 1

## 2021-05-16 MED ORDER — IOPAMIDOL (ISOVUE-300) INJECTION 61%
100.0000 mL | Freq: Once | INTRAVENOUS | Status: AC | PRN
  Administered 2021-05-16: 100 mL via INTRAVENOUS

## 2021-05-16 NOTE — ED Notes (Signed)
RN reviewed discharge instructions with pt. Pt verbalized understanding and had no further questions. VSS upon discharge.  

## 2021-05-16 NOTE — Discharge Instructions (Addendum)
You can take Tylenol or ibuprofen, available over-the-counter according to label instructions as needed for pain.  You had a CT scan performed of your abdomen today that showed a small cyst on your right kidney.  Please follow-up with your family doctor or OB/GYN for recheck.  Please return to the emergency department for recheck if you have worsening pain or new concerning symptoms.

## 2021-05-16 NOTE — ED Notes (Signed)
Pt given sandwich and water and tolerated well

## 2021-05-17 LAB — GC/CHLAMYDIA PROBE AMP (~~LOC~~) NOT AT ARMC
Chlamydia: NEGATIVE
Comment: NEGATIVE
Comment: NORMAL
Neisseria Gonorrhea: NEGATIVE

## 2021-06-02 ENCOUNTER — Telehealth: Payer: Self-pay

## 2021-06-02 NOTE — Telephone Encounter (Signed)
Telephoned patient and she's approved for mammogram scholarship. Patient will call back when she has time to document appointment information. ?

## 2022-10-16 ENCOUNTER — Other Ambulatory Visit: Payer: Self-pay

## 2022-10-16 ENCOUNTER — Encounter (HOSPITAL_COMMUNITY): Payer: Self-pay | Admitting: Emergency Medicine

## 2022-10-16 ENCOUNTER — Emergency Department (HOSPITAL_COMMUNITY)
Admission: EM | Admit: 2022-10-16 | Discharge: 2022-10-16 | Disposition: A | Discharge status: Home / Self Care | Payer: BC Managed Care – PPO | Attending: Emergency Medicine | Admitting: Emergency Medicine

## 2022-10-16 DIAGNOSIS — M5416 Radiculopathy, lumbar region: Secondary | ICD-10-CM | POA: Diagnosis not present

## 2022-10-16 DIAGNOSIS — M545 Low back pain, unspecified: Secondary | ICD-10-CM | POA: Diagnosis present

## 2022-10-16 MED ORDER — PREDNISONE 20 MG PO TABS: 40.0000 mg | ORAL_TABLET | Freq: Every day | ORAL | 0 refills | Status: AC

## 2022-10-16 MED ORDER — CYCLOBENZAPRINE HCL 10 MG PO TABS: 10.0000 mg | ORAL_TABLET | Freq: Three times a day (TID) | ORAL | 0 refills | Status: AC | PRN

## 2022-10-16 MED ORDER — PREDNISONE 20 MG PO TABS
60.0000 mg | ORAL_TABLET | Freq: Once | ORAL | Status: AC
  Administered 2022-10-16: 60 mg via ORAL
  Filled 2022-10-16: qty 3

## 2022-10-16 NOTE — ED Triage Notes (Signed)
Pt has right lower back pain radiating down her right leg.

## 2022-10-16 NOTE — ED Provider Notes (Signed)
[facility]-EMERGENCY DEPT [facility] Emergency Department Provider Note MRN:  [id number]  Arrival date & time: 10/16/22     Chief Complaint   Back Pain   History of Present Illness   Jessica Fernandez is a 42 y.o. year-old female presents to the ED with chief complaint of right sided low back pain that radiates in the leg.  She describes that pain as a burning sensation.  States that it has been going on for about a month.  She denies any injuries.  Denies fevers, chills, nausea, vomiting, dysuria, or new/unusual vaginal discharge.  Denies having had these symptoms before.  History provided by patient.   Review of Systems  Pertinent positive and negative review of systems noted in HPI.    Physical Exam   Vitals:   10/16/22 0515 10/16/22 0530  BP: 121/79 (!) 156/98  Pulse: 70 80  Resp: 18   Temp:    SpO2: 98% 99%    CONSTITUTIONAL:  non toxic-appearing, NAD NEURO:  Alert and oriented x 3, CN 3-12 grossly intact EYES:  eyes equal and reactive ENT/NECK:  Supple, no stridor  CARDIO:  appears well-perfused  PULM:  No respiratory distress,  GI/GU:  non-distended,  MSK/SPINE:  No gross deformities, no edema, moves all extremities, right sided lumbar paraspinal muscle tenderness, normal patella reflexes bilaterally, normal great toe extension and plantar flexion SKIN:  no rash, atraumatic   *Additional and/or pertinent findings included in MDM below  Diagnostic and Interventional Summary    EKG Interpretation Date/Time:    Ventricular Rate:    PR Interval:    QRS Duration:    QT Interval:    QTC Calculation:   R Axis:      Text Interpretation:         Labs Reviewed - No data to display  No orders to display    Medications  predniSONE (DELTASONE) tablet 60 mg (60 mg Oral Given 10/16/22 0546)     Procedures  /  Critical Care Procedures  ED Course and Medical Decision Making  I have reviewed the triage vital signs, the nursing notes, and pertinent available records  from the EMR.  Social Determinants Affecting Complexity of Care: Patient has no clinically significant social determinants affecting this chief complaint..   ED Course:    Medical Decision Making Patient here with right sided low back pain that radiates to the right leg and has been going on for about a month.  Denies any injuries.  She doesn't have any weakness or objective numbness on exam.    Symptoms seem most consistent with lumbar radiculopathy vs sciatica.  She doesn't have any urinary symptoms.  Doubt UTI, kidney stone.  Given length of time that symptoms have been going on, I doubt intraabdominal pathology.  Vital signs are stable and patient seems stable for discharge and outpatient follow-up.  I've advised her to follow-up with spine/ortho/sports medicine.  Risk Prescription drug management.         Consultants: No consultations were needed in caring for this patient.   Treatment and Plan: Emergency department workup does not suggest an emergent condition requiring admission or immediate intervention beyond  what has been performed at this time. The patient is safe for discharge and has  been instructed to return immediately for worsening symptoms, change in  symptoms or any other concerns    Final Clinical Impressions(s) / ED Diagnoses     ICD-10-CM   1. Lumbar radiculopathy  M54.16  ED Discharge Orders          Ordered    predniSONE (DELTASONE) 20 MG tablet  Daily        10/16/22 0550    cyclobenzaprine (FLEXERIL) 10 MG tablet  3 times daily PRN        10/16/22 0550              Discharge Instructions Discussed with and Provided to Patient:   Discharge Instructions   None      Roxy Horseman, PA-C 10/16/22 0551    Gilda Crease, MD 10/16/22 0630

## 2023-08-03 IMAGING — CT CT ABD-PELV W/ CM
2 of 5 series · 16 of 46 positions shown, 18 images · IV contrast (APPLIED)
Comparison: None.

CLINICAL DATA: Lower abdominal pain

EXAM:
CT ABDOMEN AND PELVIS WITH CONTRAST
TECHNIQUE: Multidetector CT imaging of the abdomen and pelvis was performed
using the standard protocol following bolus administration of
intravenous contrast.

[Series 3: abdomen 5.0 · axial · 0.80mm/px · z∈[-410,-35]mm · 13 of 89 slices shown, 15 images]
[im 7/89  soft-tissue]
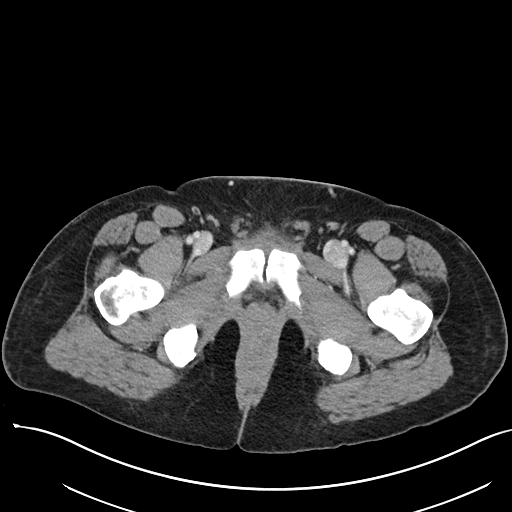
[im 7/89  bone]
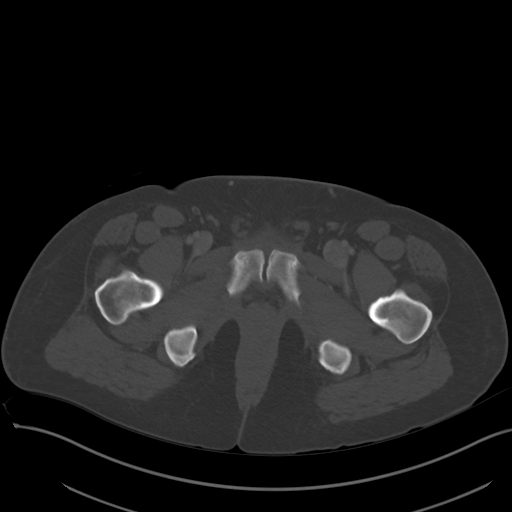
[im 13/89  soft-tissue]
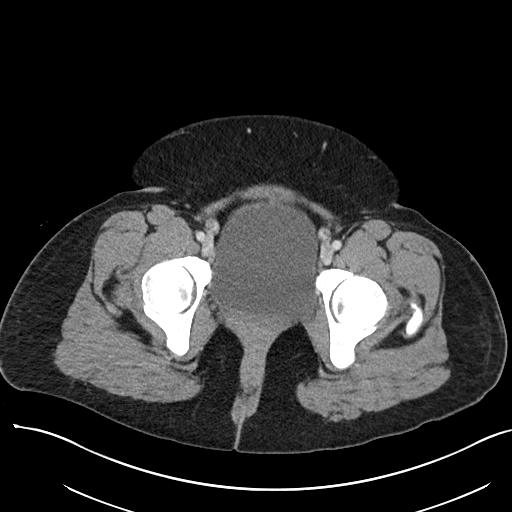
[im 19/89  soft-tissue]
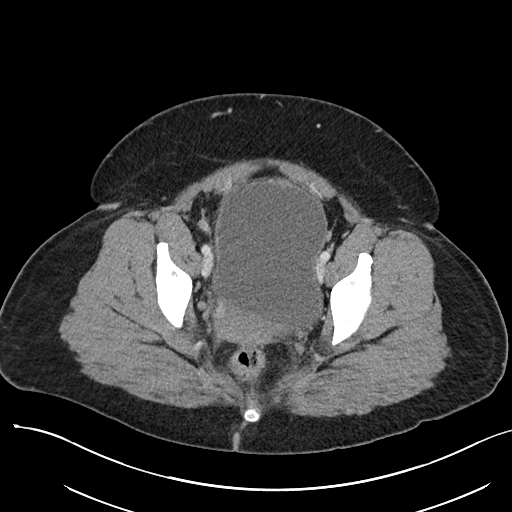
[im 26/89  soft-tissue]
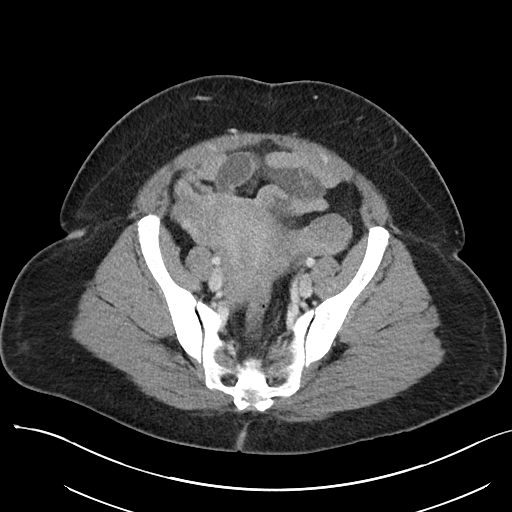
[im 32/89  soft-tissue]
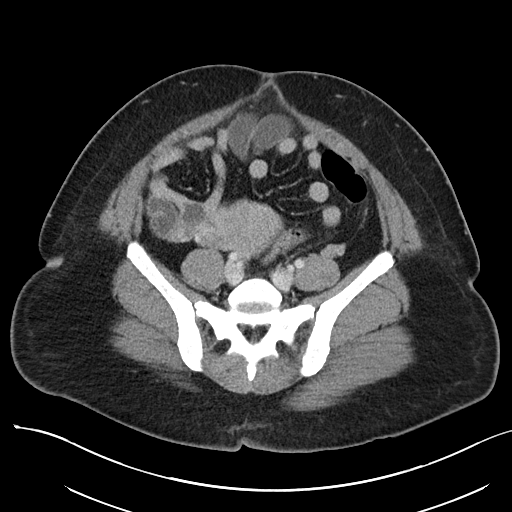
[im 38/89  soft-tissue]
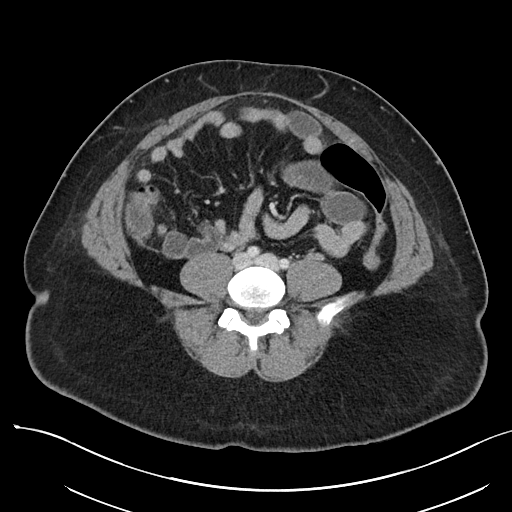
[im 45/89  soft-tissue]
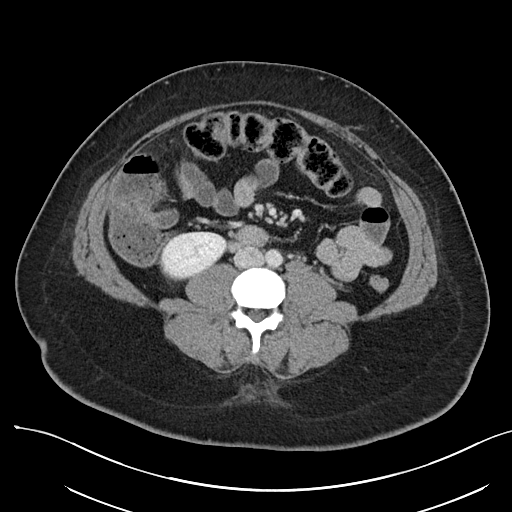
[im 51/89  soft-tissue]
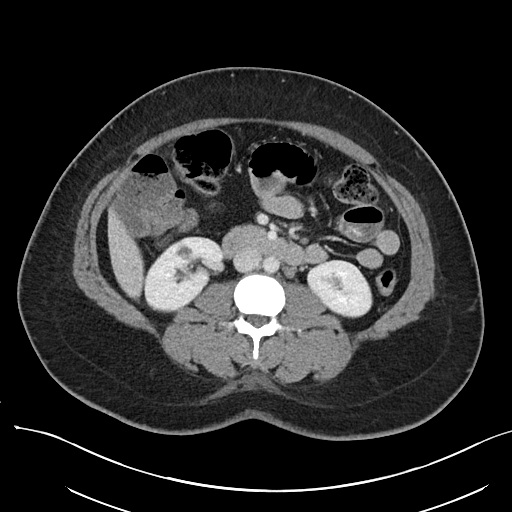
[im 57/89  soft-tissue]
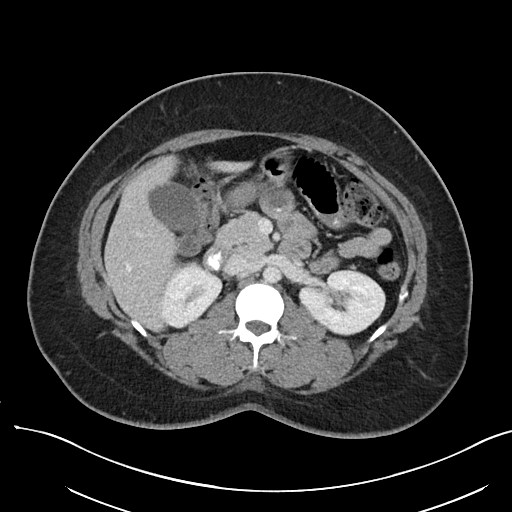
[im 57/89  bone]
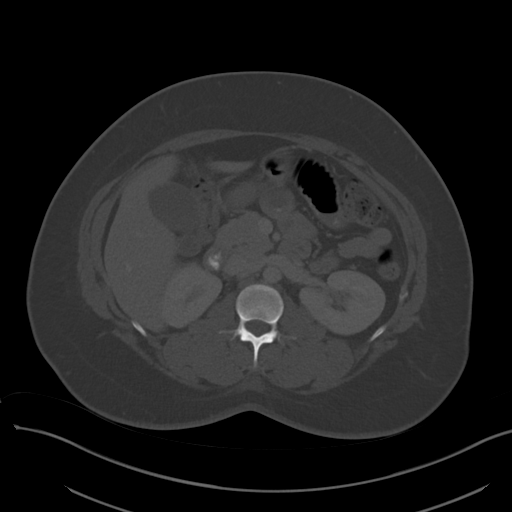
[im 63/89  soft-tissue]
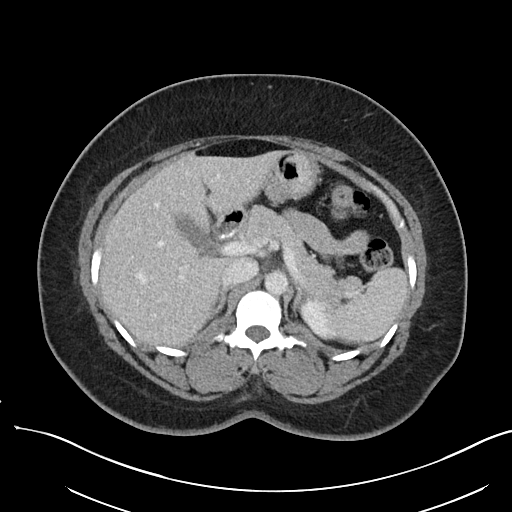
[im 70/89  soft-tissue]
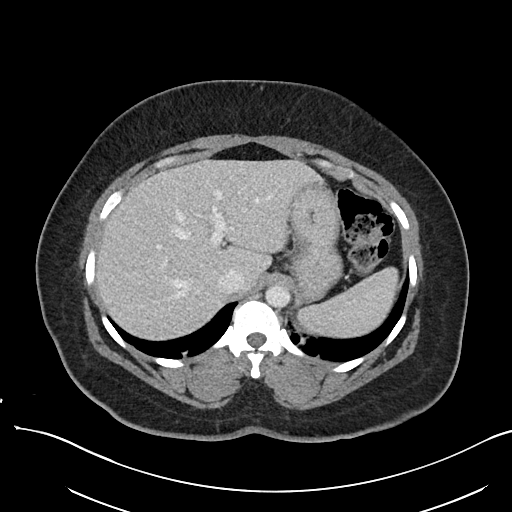
[im 76/89  soft-tissue]
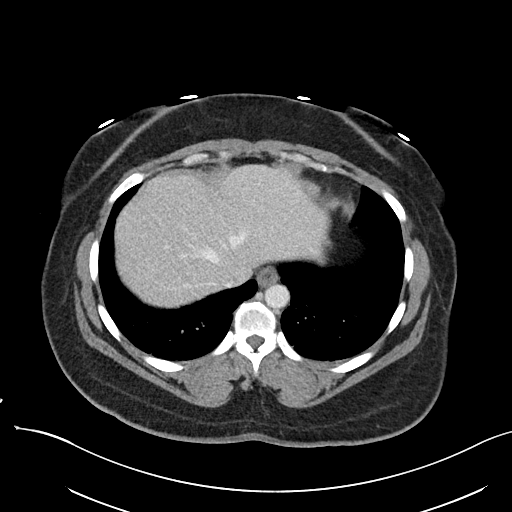
[im 82/89  soft-tissue]
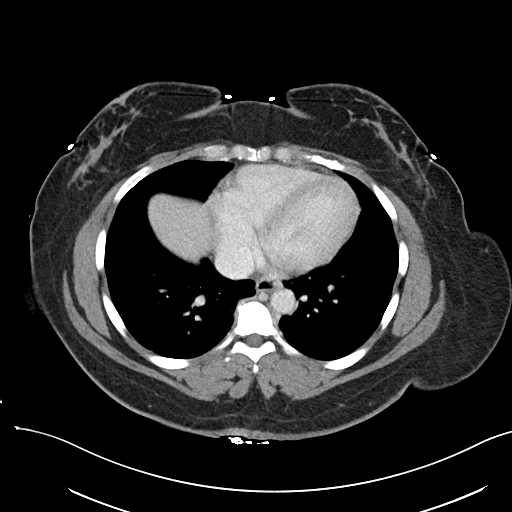

[Series 6: abdomen 3.0 mpr cor · coronal · 0.84mm/px · 3 of 110 slices shown]
[im 37/110  soft-tissue]
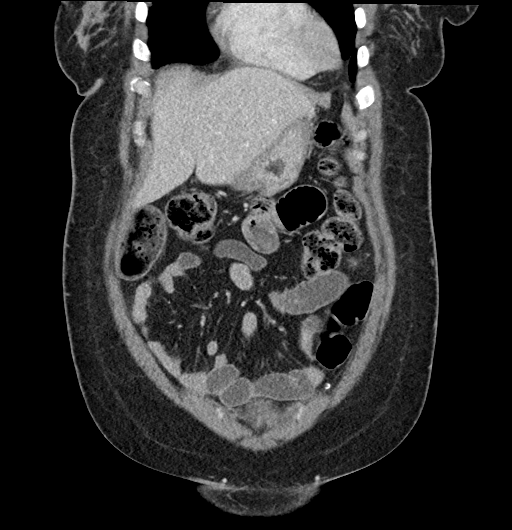
[im 49/110  soft-tissue]
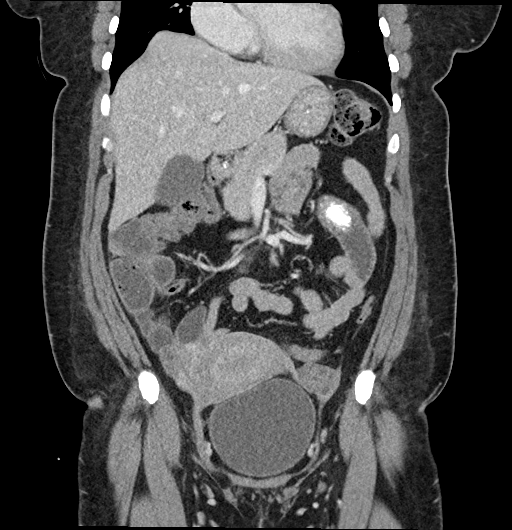
[im 61/110  soft-tissue]
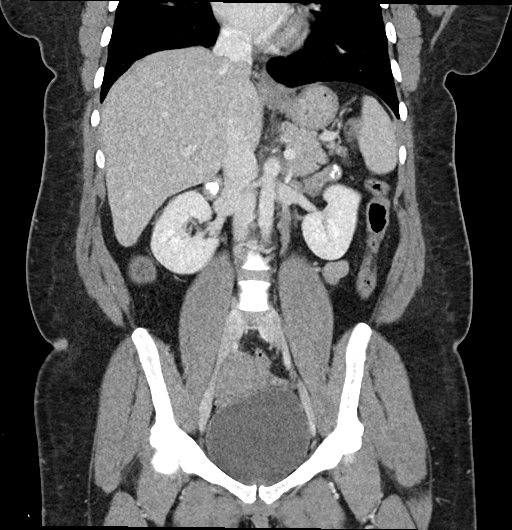

[16 of 46 positions shown; findings below may reference images not displayed]

RADIATION DOSE REDUCTION: This exam was performed according to the
departmental dose-optimization program which includes automated
exposure control, adjustment of the mA and/or kV according to
patient size and/or use of iterative reconstruction technique.

CONTRAST:  100mL MSC302-NFF IOPAMIDOL (MSC302-NFF) INJECTION 61%
FINDINGS: Lower chest: No acute abnormality.

Hepatobiliary: No focal hepatic abnormality. Gallbladder
unremarkable.

Pancreas: No focal abnormality or ductal dilatation.

Spleen: No focal abnormality.  Normal size.

Adrenals/Urinary Tract: 10 mm low-density lesion in the midpole of
the right kidney most compatible with cyst. Adrenal glands
unremarkable. No stones or hydronephrosis. Urinary bladder
unremarkable.

Stomach/Bowel: Normal appendix. Several mildly prominent
fluid-filled proximal and mid small bowel loops. Distal small bowel
loops are decompressed. Cannot exclude early partial small bowel
obstruction. Appendix is normal. Stomach and large bowel grossly
unremarkable.

Vascular/Lymphatic: No evidence of aneurysm or adenopathy.

Reproductive: Uterus and adnexa unremarkable.  No mass.

Other: No free fluid or free air.

Musculoskeletal: No acute bony abnormality.
IMPRESSION: Mildly prominent fluid-filled proximal to mid small bowel loops.
Cannot exclude early low grade partial small bowel obstruction.

Small cyst in the midpole of the right kidney.

## 2023-08-03 IMAGING — US US PELVIS COMPLETE TRANSABD/TRANSVAG W DUPLEX AND/OR DOPPLER
1 series · 13 of 25 positions shown · non-contrast
Comparison: None.

CLINICAL DATA: Pelvic pain.

EXAM:
TRANSABDOMINAL AND TRANSVAGINAL ULTRASOUND OF PELVIS
DOPPLER ULTRASOUND OF OVARIES
TECHNIQUE: Both transabdominal and transvaginal ultrasound examinations of the
pelvis were performed. Transabdominal technique was performed for
global imaging of the pelvis including uterus, ovaries, adnexal
regions, and pelvic cul-de-sac.
It was necessary to proceed with endovaginal exam following the
transabdominal exam to visualize the endometrium and ovaries. Color
and duplex Doppler ultrasound was utilized to evaluate blood flow to
the ovaries.

[Series 1: us pelvic complete w transvaginal and torsion righ · 13 of 35 slices shown]
[im 1/35]
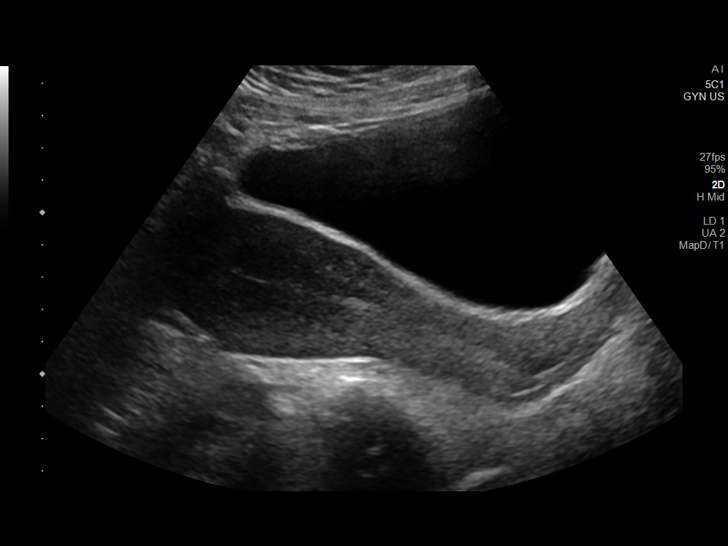
[im 3/35]
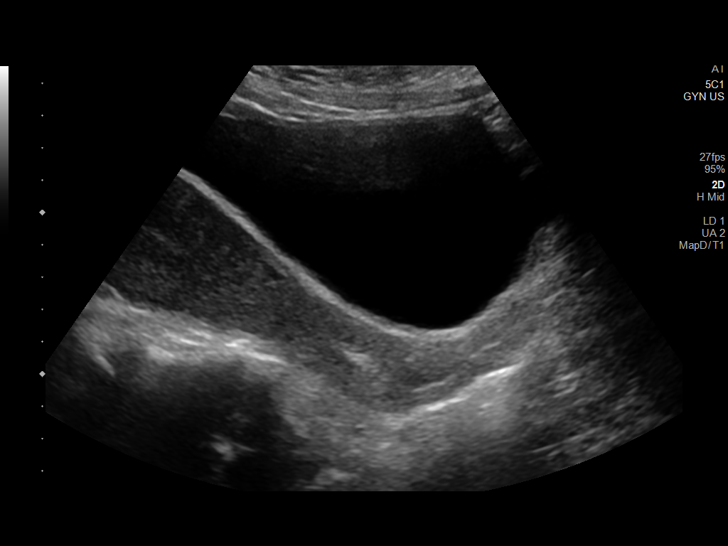
[im 6/35]
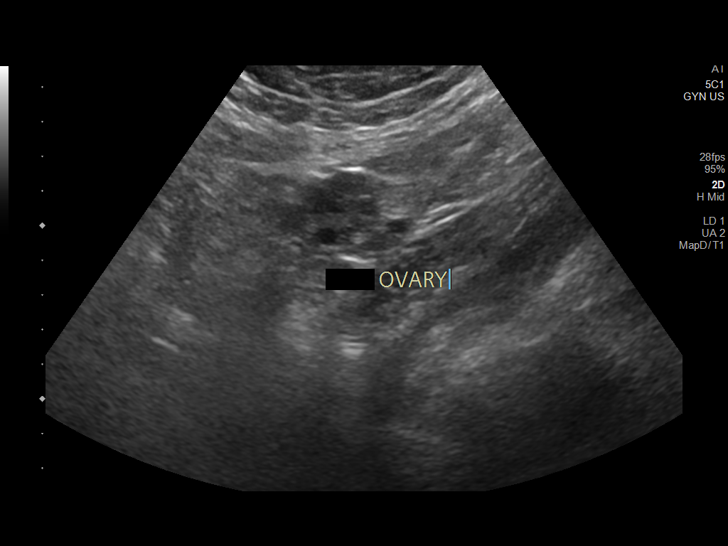
[im 9/35]
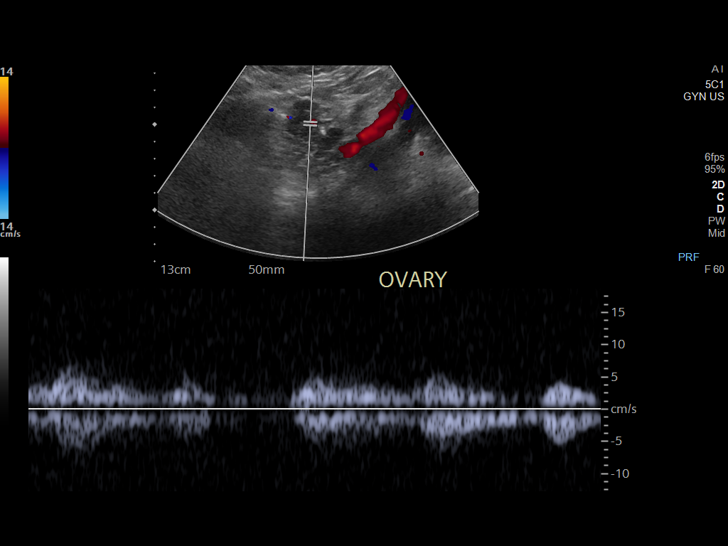
[im 12/35]
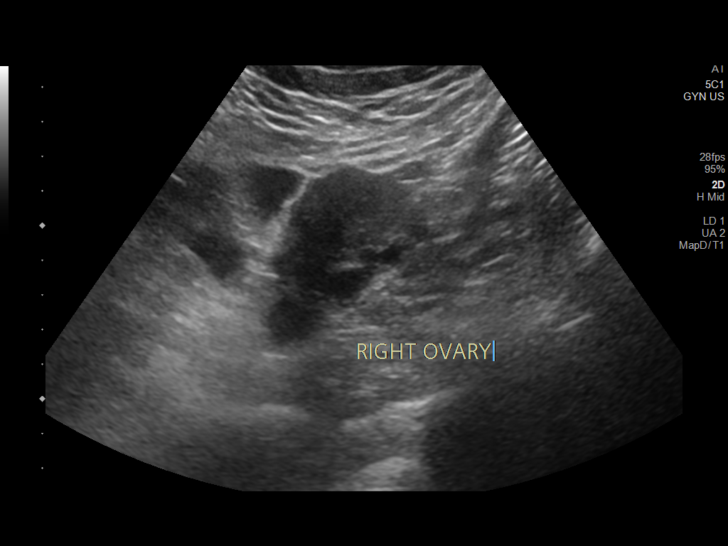
[im 15/35]
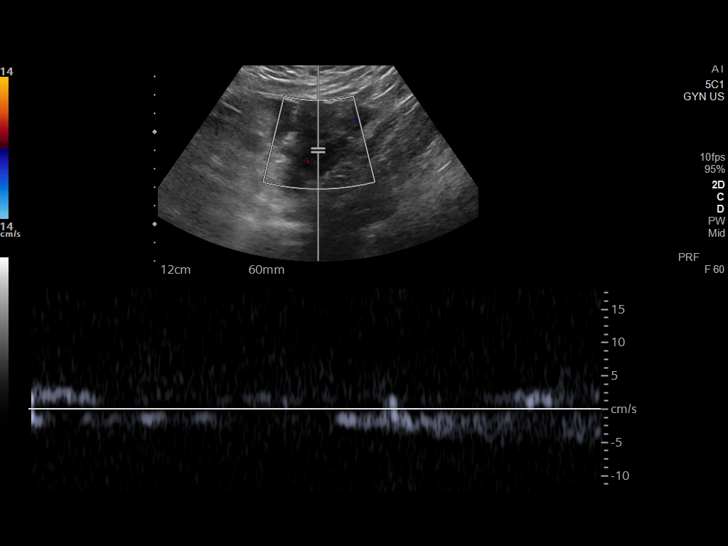
[im 18/35]
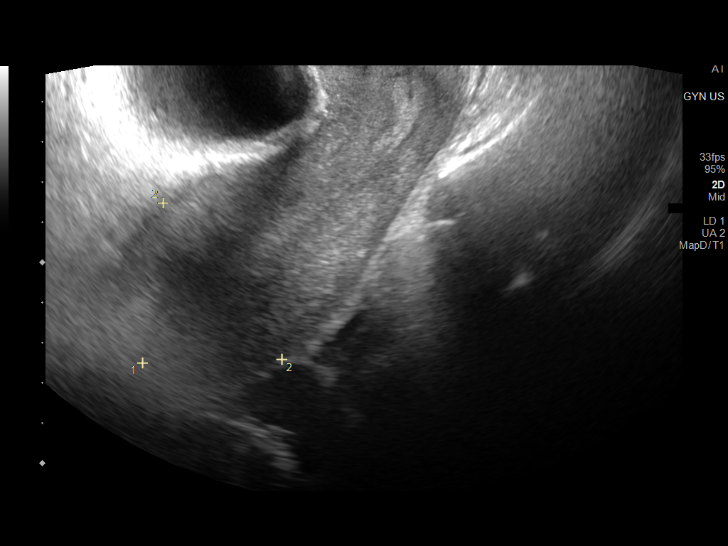
[im 20/35]
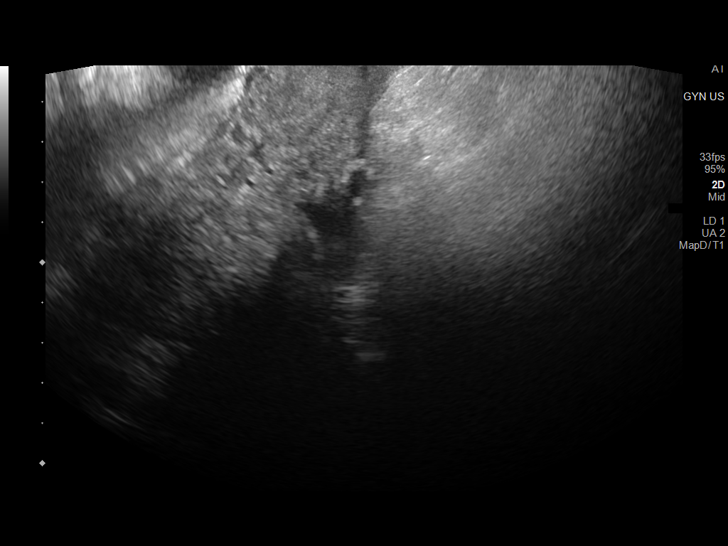
[im 23/35]
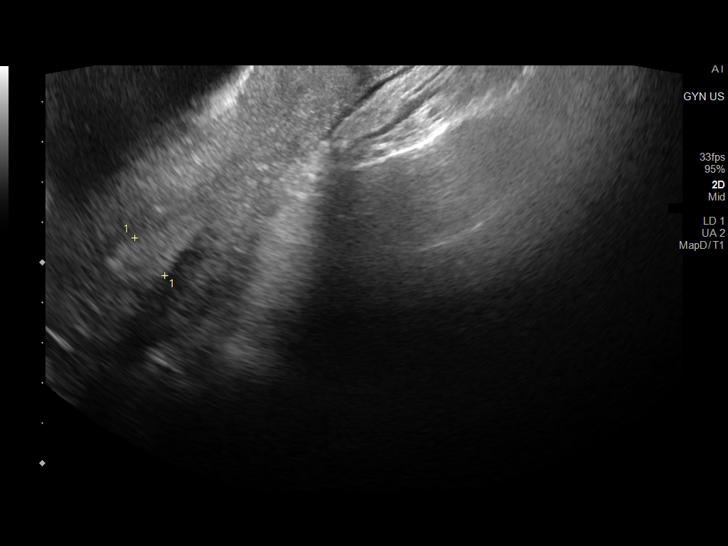
[im 26/35]
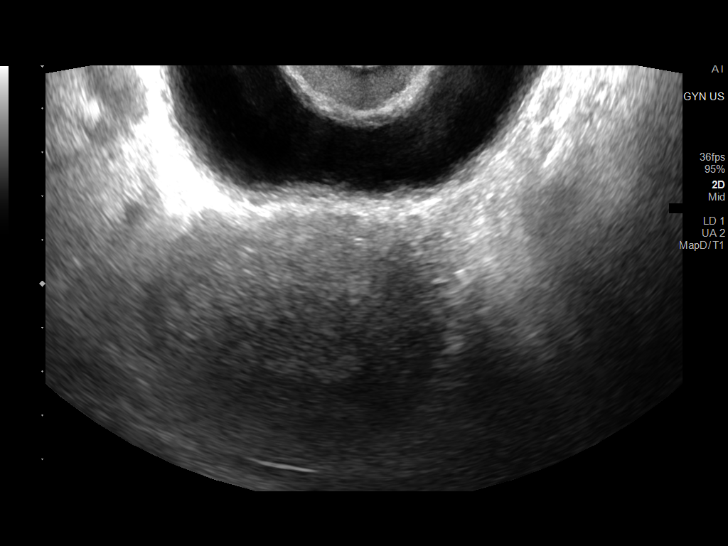
[im 29/35]
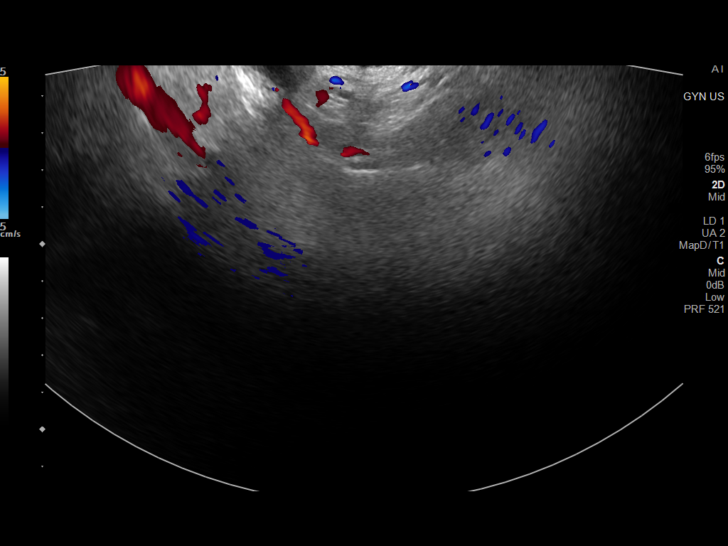
[im 32/35]
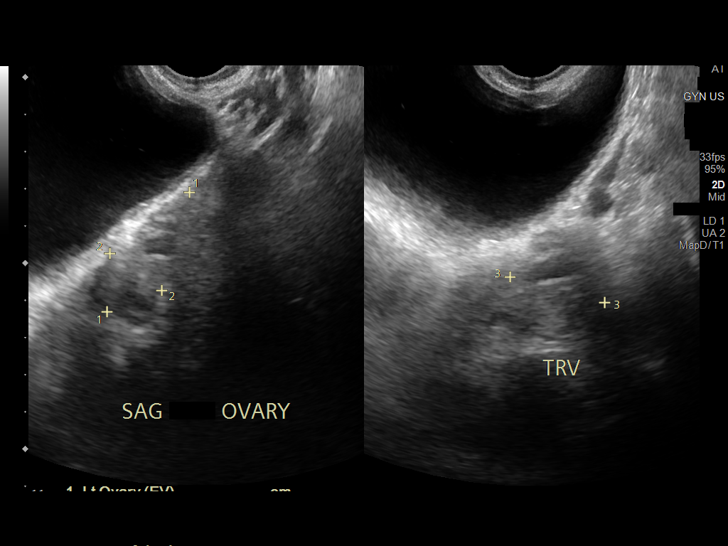
[im 35/35]
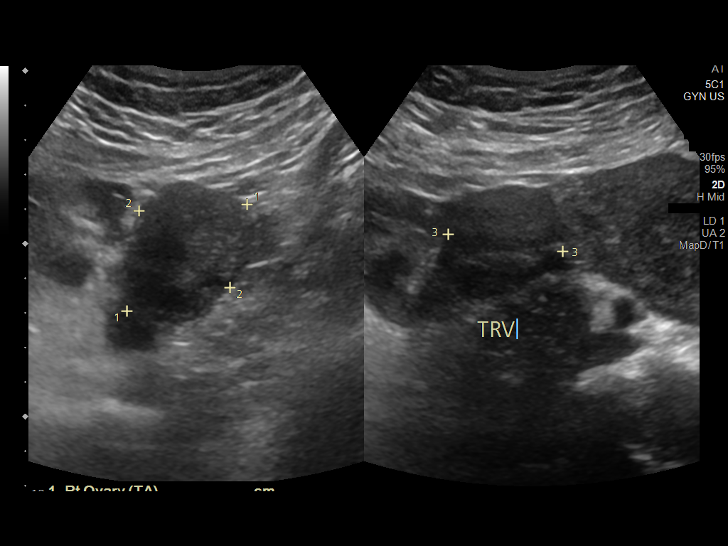

[13 of 25 positions shown; findings below may reference images not displayed]

FINDINGS: Uterus

Measurements: 11.2 x 4.9 x 6.9 cm = volume: 200 mL. No fibroids or
other mass visualized.

Endometrium

Thickness: 12 mm.  No focal abnormality visualized.

Right ovary

Measurements: 4.7 x 3.4 x 3.4 cm = volume: 28 mL. Normal
appearance/no adnexal mass.

Left ovary

Measurements: 3.9 x 1.7 x 2.6 cm = volume: 9.2 mL. Normal
appearance/no adnexal mass.

Pulsed Doppler evaluation of both ovaries demonstrates normal
low-resistance arterial and venous waveforms.

Other findings

No abnormal free fluid.
IMPRESSION: Unremarkable pelvic ultrasound.
